# Patient Record
Sex: Female | Born: 1975 | Race: Black or African American | Hispanic: No | Marital: Single | Smoking: Never smoker
Health system: Southern US, Community
[De-identification: ages and names within clinical notes are randomized; demographics above are authoritative.]

## PROBLEM LIST (undated history)

## (undated) DIAGNOSIS — C801 Malignant (primary) neoplasm, unspecified: Secondary | ICD-10-CM

## (undated) DIAGNOSIS — IMO0001 Reserved for inherently not codable concepts without codable children: Secondary | ICD-10-CM

## (undated) DIAGNOSIS — O219 Vomiting of pregnancy, unspecified: Secondary | ICD-10-CM

## (undated) DIAGNOSIS — J45909 Unspecified asthma, uncomplicated: Secondary | ICD-10-CM

## (undated) DIAGNOSIS — T7840XA Allergy, unspecified, initial encounter: Secondary | ICD-10-CM

## (undated) DIAGNOSIS — R896 Abnormal cytological findings in specimens from other organs, systems and tissues: Secondary | ICD-10-CM

## (undated) DIAGNOSIS — Z22330 Carrier of Group B streptococcus: Secondary | ICD-10-CM

## (undated) DIAGNOSIS — B977 Papillomavirus as the cause of diseases classified elsewhere: Secondary | ICD-10-CM

## (undated) DIAGNOSIS — Z8619 Personal history of other infectious and parasitic diseases: Secondary | ICD-10-CM

## (undated) DIAGNOSIS — N870 Mild cervical dysplasia: Secondary | ICD-10-CM

## (undated) DIAGNOSIS — R87619 Unspecified abnormal cytological findings in specimens from cervix uteri: Secondary | ICD-10-CM

## (undated) DIAGNOSIS — IMO0002 Reserved for concepts with insufficient information to code with codable children: Secondary | ICD-10-CM

## (undated) DIAGNOSIS — Z8673 Personal history of transient ischemic attack (TIA), and cerebral infarction without residual deficits: Secondary | ICD-10-CM

## (undated) DIAGNOSIS — K219 Gastro-esophageal reflux disease without esophagitis: Secondary | ICD-10-CM

## (undated) HISTORY — DX: Abnormal cytological findings in specimens from other organs, systems and tissues: R89.6

## (undated) HISTORY — DX: Unspecified asthma, uncomplicated: J45.909

## (undated) HISTORY — DX: Reserved for inherently not codable concepts without codable children: IMO0001

## (undated) HISTORY — DX: Papillomavirus as the cause of diseases classified elsewhere: B97.7

## (undated) HISTORY — DX: Gastro-esophageal reflux disease without esophagitis: K21.9

## (undated) HISTORY — DX: Malignant (primary) neoplasm, unspecified: C80.1

## (undated) HISTORY — DX: Reserved for concepts with insufficient information to code with codable children: IMO0002

## (undated) HISTORY — DX: Allergy, unspecified, initial encounter: T78.40XA

## (undated) HISTORY — DX: Personal history of transient ischemic attack (TIA), and cerebral infarction without residual deficits: Z86.73

## (undated) HISTORY — DX: Carrier of group B Streptococcus: Z22.330

## (undated) HISTORY — DX: Vomiting of pregnancy, unspecified: O21.9

## (undated) HISTORY — DX: Personal history of other infectious and parasitic diseases: Z86.19

## (undated) HISTORY — PX: PARTIAL HYSTERECTOMY: SHX80

## (undated) HISTORY — DX: Mild cervical dysplasia: N87.0

## (undated) HISTORY — DX: Unspecified abnormal cytological findings in specimens from cervix uteri: R87.619

---

## 1997-11-17 ENCOUNTER — Encounter: Admission: RE | Admit: 1997-11-17 | Discharge: 1997-11-17 | Payer: Self-pay | Admitting: *Deleted

## 1999-05-18 ENCOUNTER — Other Ambulatory Visit: Admission: RE | Admit: 1999-05-18 | Discharge: 1999-05-18 | Payer: Self-pay | Admitting: Internal Medicine

## 1999-08-12 ENCOUNTER — Other Ambulatory Visit: Admission: RE | Admit: 1999-08-12 | Discharge: 1999-08-12 | Payer: Self-pay | Admitting: Internal Medicine

## 2001-02-19 ENCOUNTER — Other Ambulatory Visit: Admission: RE | Admit: 2001-02-19 | Discharge: 2001-02-19 | Payer: Self-pay | Admitting: *Deleted

## 2001-08-29 ENCOUNTER — Inpatient Hospital Stay (HOSPITAL_COMMUNITY): Admission: AD | Admit: 2001-08-29 | Discharge: 2001-08-31 | Payer: Self-pay | Admitting: Gynecology

## 2001-10-08 ENCOUNTER — Other Ambulatory Visit: Admission: RE | Admit: 2001-10-08 | Discharge: 2001-10-08 | Payer: Self-pay | Admitting: Gynecology

## 2002-07-13 ENCOUNTER — Emergency Department (HOSPITAL_COMMUNITY): Admission: EM | Admit: 2002-07-13 | Discharge: 2002-07-13 | Payer: Self-pay | Admitting: Emergency Medicine

## 2002-11-13 ENCOUNTER — Other Ambulatory Visit: Admission: RE | Admit: 2002-11-13 | Discharge: 2002-11-13 | Payer: Self-pay | Admitting: Gynecology

## 2005-03-28 DIAGNOSIS — N870 Mild cervical dysplasia: Secondary | ICD-10-CM

## 2005-03-28 HISTORY — DX: Mild cervical dysplasia: N87.0

## 2005-06-16 ENCOUNTER — Emergency Department (HOSPITAL_COMMUNITY): Admission: EM | Admit: 2005-06-16 | Discharge: 2005-06-16 | Payer: Self-pay | Admitting: Family Medicine

## 2007-11-02 ENCOUNTER — Emergency Department (HOSPITAL_COMMUNITY): Admission: EM | Admit: 2007-11-02 | Discharge: 2007-11-02 | Payer: Self-pay | Admitting: Emergency Medicine

## 2008-03-28 DIAGNOSIS — Z22330 Carrier of Group B streptococcus: Secondary | ICD-10-CM

## 2008-03-28 HISTORY — DX: Carrier of group B Streptococcus: Z22.330

## 2008-06-25 ENCOUNTER — Inpatient Hospital Stay (HOSPITAL_COMMUNITY): Admission: AD | Admit: 2008-06-25 | Discharge: 2008-06-25 | Payer: Self-pay | Admitting: Obstetrics and Gynecology

## 2008-11-27 ENCOUNTER — Ambulatory Visit (HOSPITAL_COMMUNITY): Admission: RE | Admit: 2008-11-27 | Discharge: 2008-11-27 | Payer: Self-pay | Admitting: Obstetrics and Gynecology

## 2008-11-28 ENCOUNTER — Inpatient Hospital Stay (HOSPITAL_COMMUNITY): Admission: AD | Admit: 2008-11-28 | Discharge: 2008-11-28 | Payer: Self-pay | Admitting: Obstetrics and Gynecology

## 2008-12-10 ENCOUNTER — Ambulatory Visit (HOSPITAL_COMMUNITY): Admission: RE | Admit: 2008-12-10 | Discharge: 2008-12-10 | Payer: Self-pay | Admitting: Obstetrics and Gynecology

## 2008-12-24 ENCOUNTER — Ambulatory Visit (HOSPITAL_COMMUNITY): Admission: RE | Admit: 2008-12-24 | Discharge: 2008-12-24 | Payer: Self-pay | Admitting: Obstetrics and Gynecology

## 2009-01-05 ENCOUNTER — Encounter (INDEPENDENT_AMBULATORY_CARE_PROVIDER_SITE_OTHER): Payer: Self-pay | Admitting: Obstetrics and Gynecology

## 2009-01-05 ENCOUNTER — Inpatient Hospital Stay (HOSPITAL_COMMUNITY): Admission: AD | Admit: 2009-01-05 | Discharge: 2009-01-08 | Payer: Self-pay | Admitting: Obstetrics and Gynecology

## 2009-10-07 ENCOUNTER — Emergency Department (HOSPITAL_COMMUNITY): Admission: EM | Admit: 2009-10-07 | Discharge: 2009-10-07 | Payer: Self-pay | Admitting: Family Medicine

## 2010-04-19 ENCOUNTER — Encounter: Payer: Self-pay | Admitting: Obstetrics and Gynecology

## 2010-07-01 LAB — CBC
HCT: 30.8 % — ABNORMAL LOW (ref 36.0–46.0)
Hemoglobin: 11.1 g/dL — ABNORMAL LOW (ref 12.0–15.0)
MCHC: 33.1 g/dL (ref 30.0–36.0)
MCHC: 33.4 g/dL (ref 30.0–36.0)
MCV: 97.9 fL (ref 78.0–100.0)
MCV: 98.9 fL (ref 78.0–100.0)
Platelets: 210 10*3/uL (ref 150–400)
RBC: 3.41 MIL/uL — ABNORMAL LOW (ref 3.87–5.11)
RDW: 15.6 % — ABNORMAL HIGH (ref 11.5–15.5)

## 2010-07-08 LAB — CBC
Hemoglobin: 13.4 g/dL (ref 12.0–15.0)
MCHC: 33.8 g/dL (ref 30.0–36.0)
Platelets: 209 10*3/uL (ref 150–400)
RDW: 13.2 % (ref 11.5–15.5)

## 2010-07-08 LAB — COMPREHENSIVE METABOLIC PANEL
ALT: 13 U/L (ref 0–35)
Albumin: 3.8 g/dL (ref 3.5–5.2)
Alkaline Phosphatase: 34 U/L — ABNORMAL LOW (ref 39–117)
BUN: 5 mg/dL — ABNORMAL LOW (ref 6–23)
Calcium: 9.6 mg/dL (ref 8.4–10.5)
Glucose, Bld: 93 mg/dL (ref 70–99)
Potassium: 3.6 mEq/L (ref 3.5–5.1)
Sodium: 138 mEq/L (ref 135–145)
Total Protein: 6.9 g/dL (ref 6.0–8.3)

## 2010-07-08 LAB — DIFFERENTIAL
Lymphs Abs: 1.8 10*3/uL (ref 0.7–4.0)
Monocytes Absolute: 0.5 10*3/uL (ref 0.1–1.0)
Monocytes Relative: 5 % (ref 3–12)
Neutro Abs: 6.9 10*3/uL (ref 1.7–7.7)
Neutrophils Relative %: 75 % (ref 43–77)

## 2010-07-08 LAB — AMYLASE: Amylase: 86 U/L (ref 27–131)

## 2010-07-08 LAB — URINALYSIS, ROUTINE W REFLEX MICROSCOPIC
Bilirubin Urine: NEGATIVE
Ketones, ur: NEGATIVE mg/dL
Leukocytes, UA: NEGATIVE
Protein, ur: NEGATIVE mg/dL

## 2010-07-08 LAB — URINE MICROSCOPIC-ADD ON

## 2010-08-13 NOTE — Discharge Summary (Signed)
[facility]  Patient:    Alicia Hanson, Alicia Hanson Visit Number: [id number] MRN: [id number]          Service Type: OBS Location: 9300 [id number] Attending Physician:  Merrily Pew Dictated by:   Antony Contras, [facility] Admit Date:  08/29/2001 Discharge Date: 08/31/2001                             Discharge Summary  DISCHARGE DIAGNOSES:          1. Intrauterine pregnancy at term.                               2. Spontaneous onset of labor.  PROCEDURE:                    Normal spontaneous vaginal delivery of a viable infant over intact perineum.  HISTORY OF PRESENT ILLNESS:   The patient is a 35 year old primigravida, LMP November 21, 2000, [facility] August 29, 2001.  Prenatal course was uncomplicated.  PRENATAL LABORATORY DATA:     Blood type O positive, antibody screen negative, sickle cell negative.  RPR, HBSAG, and HIV nonreactive.  MSAFP normal.  GBS negative.  HOSPITAL COURSE:              The patient was admitted on August 29, 2001, with spontaneous onset of labor.  She progressed to complete dilatation, delivered an Apgars 9 and 87 female infant over intact perineum.  Birth weight 6 pounds 12 ounces.  Postpartum course uncomplicated.  CBC; hematocrit 28.4, hemoglobin 9.4, WBC 18.6, platelets 225.  The patient was able to be discharged in satisfactory condition on her second postpartum day.  DISPOSITION:                  Follow up in six weeks.  Continue prenatal vitamins and iron.  Motrin for pain. Dictated by:   Antony Contras, [facility] Attending Physician:  Merrily Pew DD:  09/17/01 TD:  09/17/01 Job: [id number]

## 2010-08-13 NOTE — H&P (Signed)
[facility]  Patient:    Alicia Hanson, Alicia Hanson Visit Number: [id number] MRN: [id number]          Service Type: OBS Location: 9300 [id number] Attending Physician:  Merrily Pew Dictated by:   Antony Contras, [facility] Admit Date:  08/29/2001 Discharge Date: 08/31/2001                           History and Physical  DATE OF BIRTH:                Jul 18, 1975  DIAGNOSES:                    Spontaneous rupture of membranes at term.  HISTORY OF PRESENT ILLNESS:   Patient is a 35 year old primigravida with an LMP of November 21, 2001, [facility] August 29, 2001.  EDC was also confirmed by ultrasound.  Prenatal course was uncomplicated.  Prenatal laboratories showed blood type O+.  Antibody screen negative.  Sickle cell negative.  RPR, HBSAG, HIV nonreactive.  Rubella immune.  MSAFP normal.  GBS negative.  Patient presented to the office at [redacted] weeks gestation for routine visit and stated that she thought that her membranes had been ruptured since about 11:00 last night when she began to leak clear fluid.  She denies any contractions and reports good fetal movement.  OBJECTIVE:  GENERAL:                      Patient is a well-developed, well-nourished black female who appears to be in no acute distress.  LUNGS:                        Clear to auscultation.  HEART:                        RRR without murmur.  ABDOMEN:                      Soft and gravid.  Positive fetal heart tones.  PELVIC:                       There is presence of clear fluid in the vaginal vault.  Ferning is positive.  Cervix is 1 cm, 50%, -3 station.  VITAL SIGNS:                  Patients weight is 127, blood pressure 112/66.  ASSESSMENT:                   Spontaneous rupture of membranes at term, confirmed by examination.  PLAN:                         Patient will be admitted to labor and delivery for labor management. Dictated by:   Antony Contras, [facility] Attending  Physician:  Merrily Pew DD:  08/29/01 TD:  08/30/01 Job: [id number]

## 2011-06-20 ENCOUNTER — Other Ambulatory Visit: Payer: Self-pay

## 2011-06-21 ENCOUNTER — Other Ambulatory Visit (INDEPENDENT_AMBULATORY_CARE_PROVIDER_SITE_OTHER): Payer: 59

## 2011-06-21 DIAGNOSIS — Z3040 Encounter for surveillance of contraceptives, unspecified: Secondary | ICD-10-CM

## 2011-08-17 ENCOUNTER — Ambulatory Visit (INDEPENDENT_AMBULATORY_CARE_PROVIDER_SITE_OTHER): Payer: 59 | Admitting: Obstetrics and Gynecology

## 2011-08-17 ENCOUNTER — Encounter: Payer: Self-pay | Admitting: Obstetrics and Gynecology

## 2011-08-17 VITALS — BP 104/62 | Resp 14 | Ht 63.0 in | Wt 122.0 lb

## 2011-08-17 DIAGNOSIS — Z124 Encounter for screening for malignant neoplasm of cervix: Secondary | ICD-10-CM

## 2011-08-17 DIAGNOSIS — Z01419 Encounter for gynecological examination (general) (routine) without abnormal findings: Secondary | ICD-10-CM

## 2011-08-17 NOTE — Progress Notes (Signed)
Contraception Depo provera last  Shot 06/21/2011 Last pap 07/2010 Last Mammo never Last Colonoscopy never Last Dexa Scan never Primary MD Dr. Allyne Gee Abuse at Home none  No complaints  Filed Vitals:   08/17/11 0947  BP: 104/62  Resp: 14   ROS: noncontributory  Physical Examination: General appearance - alert, well appearing, and in no distress Neck - supple, no significant adenopathy Chest - clear to auscultation, no wheezes, rales or rhonchi, symmetric air entry Heart - normal rate and regular rhythm Abdomen - soft, nontender, nondistended, no masses or organomegaly Breasts - breasts appear normal, no suspicious masses, no skin or nipple changes or axillary nodes Pelvic - normal external genitalia, vulva, vagina, cervix, uterus and adnexa Back exam - no CVAT Extremities - no edema, redness or tenderness in the calves or thighs  A/P Nl exam RTO 26yr for AEX

## 2011-08-23 LAB — PAP IG W/ RFLX HPV ASCU

## 2011-09-11 ENCOUNTER — Other Ambulatory Visit: Payer: Self-pay | Admitting: Obstetrics and Gynecology

## 2011-09-12 ENCOUNTER — Other Ambulatory Visit (INDEPENDENT_AMBULATORY_CARE_PROVIDER_SITE_OTHER): Payer: 59

## 2011-09-12 ENCOUNTER — Telehealth: Payer: Self-pay

## 2011-09-12 ENCOUNTER — Other Ambulatory Visit: Payer: Self-pay | Admitting: Obstetrics and Gynecology

## 2011-09-12 DIAGNOSIS — Z3009 Encounter for other general counseling and advice on contraception: Secondary | ICD-10-CM

## 2011-09-12 MED ORDER — MEDROXYPROGESTERONE ACETATE 150 MG/ML IM SUSP
150.0000 mg | Freq: Once | INTRAMUSCULAR | Status: AC
  Administered 2011-09-12: 150 mg via INTRAMUSCULAR

## 2011-09-12 NOTE — Progress Notes (Unsigned)
Next Depo due-12-04-2011

## 2011-09-12 NOTE — Telephone Encounter (Signed)
Pt came to the office this am and stated that she needed RX refill for Depo-provera as well as the injection. RX refill was called to [facility] for Depo-provera 150 mg #1 with 3 RF 1 injection every 3 months, pt was advised to pick up RX and to return to the office for injection. Alicia Hanson

## 2011-12-05 ENCOUNTER — Other Ambulatory Visit (INDEPENDENT_AMBULATORY_CARE_PROVIDER_SITE_OTHER): Payer: 59

## 2011-12-05 DIAGNOSIS — Z3009 Encounter for other general counseling and advice on contraception: Secondary | ICD-10-CM

## 2011-12-05 MED ORDER — MEDROXYPROGESTERONE ACETATE 150 MG/ML IM SUSP
150.0000 mg | Freq: Once | INTRAMUSCULAR | Status: AC
  Administered 2011-12-05: 150 mg via INTRAMUSCULAR

## 2011-12-05 NOTE — Progress Notes (Unsigned)
Next Depo due-02-26-2012

## 2012-02-27 ENCOUNTER — Other Ambulatory Visit (INDEPENDENT_AMBULATORY_CARE_PROVIDER_SITE_OTHER): Payer: 59

## 2012-02-27 DIAGNOSIS — Z3009 Encounter for other general counseling and advice on contraception: Secondary | ICD-10-CM

## 2012-02-27 MED ORDER — MEDROXYPROGESTERONE ACETATE 150 MG/ML IM SUSP
150.0000 mg | Freq: Once | INTRAMUSCULAR | Status: AC
  Administered 2012-02-27: 150 mg via INTRAMUSCULAR

## 2012-02-27 NOTE — Progress Notes (Unsigned)
Next Depo due 05-20-2012

## 2012-05-21 ENCOUNTER — Other Ambulatory Visit: Payer: 59

## 2012-05-21 MED ORDER — MEDROXYPROGESTERONE ACETATE 150 MG/ML IM SUSP
150.0000 mg | Freq: Once | INTRAMUSCULAR | Status: AC
  Administered 2012-05-21: 150 mg via INTRAMUSCULAR

## 2012-05-21 NOTE — Progress Notes (Unsigned)
Next Depo due 08-12-2012 

## 2013-04-08 ENCOUNTER — Emergency Department (INDEPENDENT_AMBULATORY_CARE_PROVIDER_SITE_OTHER)
Admission: EM | Admit: 2013-04-08 | Discharge: 2013-04-08 | Disposition: A | Discharge status: Home / Self Care | Payer: Self-pay | Source: Home / Self Care | Attending: Family Medicine | Admitting: Family Medicine

## 2013-04-08 ENCOUNTER — Encounter (HOSPITAL_COMMUNITY): Payer: Self-pay | Admitting: Emergency Medicine

## 2013-04-08 DIAGNOSIS — M542 Cervicalgia: Secondary | ICD-10-CM

## 2013-04-08 DIAGNOSIS — M545 Low back pain, unspecified: Secondary | ICD-10-CM

## 2013-04-08 MED ORDER — IBUPROFEN 800 MG PO TABS
800.0000 mg | ORAL_TABLET | Freq: Once | ORAL | Status: AC
  Administered 2013-04-08: 800 mg via ORAL

## 2013-04-08 MED ORDER — IBUPROFEN 800 MG PO TABS
ORAL_TABLET | ORAL | Status: AC
  Filled 2013-04-08: qty 1
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
## 2013-04-08 NOTE — ED Provider Notes (Signed)
Medical screening examination/treatment/procedure(s) were performed by resident physician or non-physician practitioner and as supervising physician I was immediately available for consultation/collaboration.   Pauline Good MD.   Billy Fischer, MD 04/08/13 [phone]

## 2014-01-27 ENCOUNTER — Encounter (HOSPITAL_COMMUNITY): Payer: Self-pay | Admitting: Emergency Medicine

## 2014-05-24 ENCOUNTER — Emergency Department (INDEPENDENT_AMBULATORY_CARE_PROVIDER_SITE_OTHER)
Admission: EM | Admit: 2014-05-24 | Discharge: 2014-05-24 | Disposition: A | Discharge status: Home / Self Care | Payer: Self-pay | Source: Home / Self Care | Attending: Emergency Medicine | Admitting: Emergency Medicine

## 2014-05-24 ENCOUNTER — Encounter (HOSPITAL_COMMUNITY): Payer: Self-pay | Admitting: Emergency Medicine
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
## 2014-05-24 NOTE — Discharge Instructions (Signed)

## 2015-02-05 ENCOUNTER — Ambulatory Visit: Payer: Self-pay | Admitting: Podiatry

## 2015-02-05 ENCOUNTER — Ambulatory Visit (INDEPENDENT_AMBULATORY_CARE_PROVIDER_SITE_OTHER): Payer: Managed Care, Other (non HMO) | Admitting: Podiatry

## 2015-02-05 ENCOUNTER — Encounter: Payer: Self-pay | Admitting: Podiatry

## 2015-02-05 ENCOUNTER — Ambulatory Visit (INDEPENDENT_AMBULATORY_CARE_PROVIDER_SITE_OTHER): Payer: Managed Care, Other (non HMO)

## 2015-02-05 VITALS — BP 143/83 | HR 89 | Resp 16 | Ht 63.0 in | Wt 140.0 lb

## 2015-02-05 DIAGNOSIS — M79671 Pain in right foot: Secondary | ICD-10-CM

## 2015-02-05 DIAGNOSIS — M79672 Pain in left foot: Secondary | ICD-10-CM | POA: Diagnosis not present

## 2015-02-05 DIAGNOSIS — M722 Plantar fascial fibromatosis: Secondary | ICD-10-CM

## 2015-02-05 MED ORDER — TRIAMCINOLONE ACETONIDE 10 MG/ML IJ SUSP
10.0000 mg | Freq: Once | INTRAMUSCULAR | Status: AC
  Administered 2015-02-05: 10 mg

## 2015-02-05 MED ORDER — DICLOFENAC SODIUM 75 MG PO TBEC: 75.0000 mg | DELAYED_RELEASE_TABLET | Freq: Two times a day (BID) | ORAL | Status: DC

## 2015-02-05 NOTE — Progress Notes (Signed)
   Subjective:    Patient ID: Alicia Hanson, female    DOB: 1975/05/27, 39 y.o.   MRN: [id number]  HPI Patient presents with bilateral foot pain, entire foot. Pt stated, "feel like walking on rocks"; x2 weeks.  Pt also presents with bilateral ankle pain. Pt stated, "ankles roll when walking"; x2 weeks.   Review of Systems  Musculoskeletal: Positive for myalgias.  All other systems reviewed and are negative.      Objective:   Physical Exam        Assessment & Plan:

## 2015-02-05 NOTE — Patient Instructions (Signed)

## 2015-02-08 NOTE — Progress Notes (Signed)
Subjective:     Patient ID: Alicia Hanson, female   DOB: 09-19-1975, 39 y.o.   MRN: [id number]  HPI patient presents with a lot of pain in the heels of both feet and into the arch and states that her feet feel unstable and she knows that they collapse when she walks   Review of Systems  All other systems reviewed and are negative.      Objective:   Physical Exam  Constitutional: She is oriented to person, place, and time.  Cardiovascular: Intact distal pulses.   Musculoskeletal: Normal range of motion.  Neurological: She is oriented to person, place, and time.  Skin: Skin is warm and dry.  Nursing note and vitals reviewed.  neurovascular status found to be intact muscle strength adequate with range of motion within normal limits. Patient's found to have moderate collapse medial longitudinal arch bilateral and is found to have discomfort with inflammation around the plantar heel region bilateral along with mild to moderate discomfort in the arch bilateral. Patient has good digital perfusion and is well oriented 3     Assessment:     Her fasciitis bilateral with mid arch pain along with depression of the arch which is contributory    Plan:     H&P x-rays reviewed education rendered. Today I injected the plantar fascia bilateral at its insertion 3 mg Kenalog 5 mg Xylocaine bilateral and applied fascial brace bilateral. Gave instructions on physical therapy and reappoint to recheck

## 2015-02-26 ENCOUNTER — Encounter: Payer: Self-pay | Admitting: Podiatry

## 2015-02-26 ENCOUNTER — Ambulatory Visit (INDEPENDENT_AMBULATORY_CARE_PROVIDER_SITE_OTHER): Payer: Managed Care, Other (non HMO) | Admitting: Podiatry

## 2015-02-26 VITALS — BP 103/67 | HR 83 | Resp 16

## 2015-02-26 DIAGNOSIS — M722 Plantar fascial fibromatosis: Secondary | ICD-10-CM

## 2015-02-27 NOTE — Progress Notes (Signed)
Subjective:     Patient ID: Alicia Hanson, female   DOB: 30-Nov-1975, 39 y.o.   MRN: [id number]  HPI patient states I'm feeling somewhat better but I know that my arches are part of my problem. The pain in the heel is improved   Review of Systems     Objective:   Physical Exam Neurovascular status intact with significant diminishment of pain in the plantar heel bilateral with moderate depression of the arch noted    Assessment:     Plantar fasciitis bilateral improved with structural malalignment    Plan:     Advised patient on the continuation of conservative treatments and scanned for custom orthotic devices to elevate the arch and give her long-term hopeful resolution of symptoms. Discussed continued physical therapy and stretching

## 2015-03-24 ENCOUNTER — Ambulatory Visit: Payer: Managed Care, Other (non HMO) | Admitting: *Deleted

## 2015-03-24 DIAGNOSIS — M722 Plantar fascial fibromatosis: Secondary | ICD-10-CM

## 2015-03-24 NOTE — Progress Notes (Signed)
Patient ID: Alicia Hanson, female   DOB: 09-12-1975, 39 y.o.   MRN: [id number] Patient presents for orthotic pick up.  Verbal and written break in and wear instructions given.  Patient will follow up in 4 weeks if symptoms worsen or fail to improve.

## 2015-03-24 NOTE — Patient Instructions (Signed)

## 2017-03-19 ENCOUNTER — Other Ambulatory Visit: Payer: Self-pay

## 2017-03-19 ENCOUNTER — Emergency Department (HOSPITAL_COMMUNITY)
Admission: EM | Admit: 2017-03-19 | Discharge: 2017-03-19 | Disposition: A | Discharge status: Home / Self Care | Payer: Self-pay | Attending: Emergency Medicine | Admitting: Emergency Medicine

## 2017-03-19 ENCOUNTER — Encounter (HOSPITAL_COMMUNITY): Payer: Self-pay | Admitting: *Deleted

## 2017-03-19 ENCOUNTER — Emergency Department (HOSPITAL_COMMUNITY): Payer: Self-pay

## 2017-03-19 DIAGNOSIS — R6889 Other general symptoms and signs: Secondary | ICD-10-CM

## 2017-03-19 DIAGNOSIS — R509 Fever, unspecified: Secondary | ICD-10-CM | POA: Insufficient documentation

## 2017-03-19 DIAGNOSIS — R05 Cough: Secondary | ICD-10-CM | POA: Insufficient documentation

## 2017-03-19 DIAGNOSIS — J45909 Unspecified asthma, uncomplicated: Secondary | ICD-10-CM | POA: Insufficient documentation

## 2017-03-19 DIAGNOSIS — Z79899 Other long term (current) drug therapy: Secondary | ICD-10-CM | POA: Insufficient documentation

## 2017-03-19 MED ORDER — GUAIFENESIN-DM 100-10 MG/5ML PO SYRP: 5.0000 mL | ORAL_SOLUTION | Freq: Three times a day (TID) | ORAL | 0 refills | Status: DC | PRN

## 2017-03-19 MED ORDER — ACETAMINOPHEN 325 MG PO TABS
650.0000 mg | ORAL_TABLET | Freq: Once | ORAL | Status: AC | PRN
  Administered 2017-03-19: 650 mg via ORAL
  Filled 2017-03-19: qty 2

## 2017-03-19 NOTE — ED Triage Notes (Signed)
Pt has flu like symptoms since Friday. Reports cough, fever, sweats. Bodyaches, headache, sore throat. Mask on pt at triage.

## 2017-03-19 NOTE — ED Provider Notes (Signed)
[facility] EMERGENCY DEPARTMENT Provider Note   CSN: [id number] Arrival date & time: 03/19/17  0802     History   Chief Complaint Chief Complaint  Patient presents with  . Cough  . Headache    HPI Alicia Hanson is a 41 y.o. female.  HPI Patient presents with cough fever and sweats.  Had for the last 2 days.  Has a slight sore throat but states it is more from the coughing.  Still able to eat and drink but has not had much of an appetite.  Has a history of asthma but rarely has to use her inhaler.  No sick contacts.  States she has had no real sputum production.  Has had a dull headache.  Took Motrin at home this morning and given Tylenol upon arrival.  Temperature 101.3 upon arrival.  No nausea or vomiting.  No diarrhea.  Mild dull headache.  No numbness or weakness.  Dull chest pain and diffuse myalgias. Past Medical History:  Diagnosis Date  . Abnormal Pap smear    Colpo  . ASCUS (atypical squamous cells of undetermined significance) on Pap smear   . Asthma   . Cancer ([place])   . CIN I (cervical intraepithelial neoplasia I) 2007  . 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 H/O varicella   . H/O: stroke   . High risk HPV infection   . History of rubella   . Nausea and vomiting in pregnancy     There are no active problems to display for this patient.   History reviewed. No pertinent surgical history.  OB History    Gravida Para Term Preterm AB Living   2 2 2     2    SAB TAB Ectopic Multiple Live Births           2       Home Medications    Prior to Admission medications   Medication Sig Start Date End Date Taking? Authorizing Provider  albuterol (PROVENTIL HFA;VENTOLIN HFA) 108 (90 BASE) MCG/ACT inhaler Inhale into the lungs.    [provider]  beclomethasone (QVAR) 40 MCG/ACT inhaler Inhale 2 puffs into the lungs 2 (two) times daily.    [provider]  diclofenac (VOLTAREN) 75 MG EC tablet Take 1 tablet (75 mg total) by mouth 2 (two)  times daily. 02/05/15   Wallene Huh, DPM  guaiFENesin-dextromethorphan (ROBITUSSIN DM) 100-10 MG/5ML syrup Take 5 mLs by mouth 3 (three) times daily as needed for cough. 03/19/17   Davonna Belling, MD  levalbuterol [facility] HFA) 45 MCG/ACT inhaler Inhale 1-2 puffs into the lungs every 4 (four) hours as needed.    [provider]  medroxyPROGESTERone (DEPO-PROVERA) 150 MG/ML injection Inject 150 mg into the muscle every 3 (three) months.    [provider]  valACYclovir (VALTREX) 500 MG tablet Take 500 mg by mouth 3 (three) times daily. For shingles    [provider]    Family History Family History  Problem Relation Age of Onset  . Diabetes Mother   . Hypertension Father   . Diabetes Father     Social History Social History   Tobacco Use  . Smoking status: Never Smoker  Substance Use Topics  . Alcohol use: Yes  . Drug use: No     Allergies   Oxycodone-acetaminophen   Review of Systems Review of Systems  Constitutional: Positive for appetite change, fatigue and fever.  HENT: Positive for sore throat. Negative for trouble swallowing and  voice change.   Eyes: Negative for photophobia.  Respiratory: Positive for cough.   Cardiovascular: Negative for chest pain.  Endocrine: Negative for polyuria.  Genitourinary: Negative for dysuria.  Musculoskeletal: Positive for myalgias.  Skin: Negative for rash and wound.  Neurological: Positive for headaches. Negative for weakness.  Hematological: Negative for adenopathy.  Psychiatric/Behavioral: Negative for confusion.     Physical Exam Updated Vital Signs BP 132/73 (BP Location: Right Arm)   Pulse (!) 130   Temp (!) 101.3 F (38.5 C) (Oral)   Resp 20   SpO2 99%   Physical Exam  Constitutional: She is oriented to person, place, and time. She appears well-developed and well-nourished.  HENT:  Head: Normocephalic.  Mild posterior pharyngeal erythema without exudate.  Eyes: EOM are normal.  Pupils are equal, round, and reactive to light.  Neck: Neck supple.  Cardiovascular: Normal rate and regular rhythm.  Pulmonary/Chest: Breath sounds normal.  Somewhat frequent nonproductive coughing but no focal rales or rhonchi.  Abdominal: Soft. There is no tenderness.  Musculoskeletal: Normal range of motion.  Neurological: She is alert and oriented to person, place, and time.  Skin: Skin is warm.  Psychiatric: She has a normal mood and affect.     ED Treatments / Results  Labs (all labs ordered are listed, but only abnormal results are displayed) Labs Reviewed - No data to display  EKG  EKG Interpretation None       Radiology Dg Chest 2 View  Result Date: 03/19/2017 CLINICAL DATA:  41 year old with current history of asthma, presenting with two-day history of nonproductive cough, myalgias and fever. EXAM: CHEST  2 VIEW COMPARISON:  None. FINDINGS: Cardiomediastinal silhouette unremarkable. Lungs clear. Bronchovascular markings normal. Pulmonary vascularity normal. No visible pleural effusions. No pneumothorax. Minimal degenerative changes involving the lower thoracic spine. IMPRESSION: No acute cardiopulmonary disease. Electronically Signed   By: Evangeline Dakin M.D.   On: 03/19/2017 09:19    Procedures Procedures (including critical care time)  Medications Ordered in ED Medications  acetaminophen (TYLENOL) tablet 650 mg (650 mg Oral Given 03/19/17 0818)     Initial Impression / Assessment and Plan / ED Course  I have reviewed the triage vital signs and the nursing notes.  Pertinent labs & imaging results that were available during my care of the patient were reviewed by me and considered in my medical decision making (see chart for details).    Patient with URI symptoms.  Slight sore throat with out exudate and doubt strep.  Flulike illness.  Discussed patient about Tamiflu.  At this point we have decided to defer since it is around 48 hours from onset.  She is  mildly high risk with the asthma but has very well-controlled asthma.  Discharge home with symptomatic treatment.  Heart rate has normalized.  Is not ill-appearing.  Final Clinical Impressions(s) / ED Diagnoses   Final diagnoses:  Flu-like symptoms    ED Discharge Orders        Ordered    guaiFENesin-dextromethorphan (ROBITUSSIN DM) 100-10 MG/5ML syrup  3 times daily PRN     03/19/17 0946       Davonna Belling, MD 03/19/17 [phone]

## 2018-03-29 DIAGNOSIS — J4520 Mild intermittent asthma, uncomplicated: Secondary | ICD-10-CM | POA: Insufficient documentation

## 2018-04-24 ENCOUNTER — Other Ambulatory Visit: Payer: Self-pay | Admitting: Obstetrics and Gynecology

## 2018-04-24 DIAGNOSIS — Z1231 Encounter for screening mammogram for malignant neoplasm of breast: Secondary | ICD-10-CM

## 2018-04-26 ENCOUNTER — Ambulatory Visit
Admission: RE | Admit: 2018-04-26 | Discharge: 2018-04-26 | Disposition: A | Discharge status: Home / Self Care | Payer: BLUE CROSS/BLUE SHIELD | Source: Ambulatory Visit | Attending: Obstetrics and Gynecology | Admitting: Obstetrics and Gynecology

## 2018-04-26 DIAGNOSIS — Z1231 Encounter for screening mammogram for malignant neoplasm of breast: Secondary | ICD-10-CM

## 2018-10-25 DIAGNOSIS — N939 Abnormal uterine and vaginal bleeding, unspecified: Secondary | ICD-10-CM | POA: Insufficient documentation

## 2019-03-28 ENCOUNTER — Ambulatory Visit: Payer: Medicaid Other | Attending: Internal Medicine

## 2019-03-28 DIAGNOSIS — Z20822 Contact with and (suspected) exposure to covid-19: Secondary | ICD-10-CM

## 2019-03-29 LAB — NOVEL CORONAVIRUS, NAA: SARS-CoV-2, NAA: NOT DETECTED

## 2019-06-12 ENCOUNTER — Other Ambulatory Visit: Payer: Self-pay | Admitting: Obstetrics and Gynecology

## 2019-06-12 DIAGNOSIS — N6310 Unspecified lump in the right breast, unspecified quadrant: Secondary | ICD-10-CM

## 2019-06-19 ENCOUNTER — Ambulatory Visit
Admission: RE | Admit: 2019-06-19 | Discharge: 2019-06-19 | Disposition: A | Discharge status: Home / Self Care | Payer: Medicaid Other | Source: Ambulatory Visit | Attending: Obstetrics and Gynecology | Admitting: Obstetrics and Gynecology

## 2019-06-19 ENCOUNTER — Other Ambulatory Visit: Payer: Self-pay | Admitting: Obstetrics and Gynecology

## 2019-06-19 ENCOUNTER — Other Ambulatory Visit: Payer: Self-pay

## 2019-06-19 DIAGNOSIS — R599 Enlarged lymph nodes, unspecified: Secondary | ICD-10-CM

## 2019-06-19 DIAGNOSIS — N6310 Unspecified lump in the right breast, unspecified quadrant: Secondary | ICD-10-CM

## 2019-06-19 DIAGNOSIS — N6320 Unspecified lump in the left breast, unspecified quadrant: Secondary | ICD-10-CM

## 2019-09-20 ENCOUNTER — Other Ambulatory Visit: Payer: Self-pay

## 2019-09-20 ENCOUNTER — Ambulatory Visit
Admission: RE | Admit: 2019-09-20 | Discharge: 2019-09-20 | Disposition: A | Discharge status: Home / Self Care | Payer: Medicaid Other | Source: Ambulatory Visit | Attending: Obstetrics and Gynecology | Admitting: Obstetrics and Gynecology

## 2019-09-20 DIAGNOSIS — R599 Enlarged lymph nodes, unspecified: Secondary | ICD-10-CM

## 2019-09-20 DIAGNOSIS — N6310 Unspecified lump in the right breast, unspecified quadrant: Secondary | ICD-10-CM

## 2019-11-21 ENCOUNTER — Ambulatory Visit
Admission: EM | Admit: 2019-11-21 | Discharge: 2019-11-21 | Disposition: A | Discharge status: Home / Self Care | Payer: Medicaid Other

## 2019-11-21 ENCOUNTER — Other Ambulatory Visit: Payer: Self-pay

## 2019-11-21 DIAGNOSIS — T63441A Toxic effect of venom of bees, accidental (unintentional), initial encounter: Secondary | ICD-10-CM

## 2019-11-21 DIAGNOSIS — L309 Dermatitis, unspecified: Secondary | ICD-10-CM | POA: Diagnosis not present

## 2019-11-21 NOTE — Discharge Instructions (Signed)
Take Benadryl every 6 hours while awake.  Do not drive, operate machinery, or drink alcohol with this medication as it may cause drowsiness.    If you need to be alert and cannot take Benadryl, take Claritin instead once a day.    Call 911 if you have difficulty swallowing or breathing.

## 2019-11-21 NOTE — ED Provider Notes (Signed)
[facility]    CSN: [id number] Arrival date & time: 11/21/19  1727      History   Chief Complaint Chief Complaint  Patient presents with  . Insect Bite    HPI Alicia Hanson is a 44 y.o. female.   Patient presents with a bee sting on her chest which occurred this afternoon.  She denies difficulty swallowing or breathing.  No known allergy to bee stings.  Treatment attempted with an ice pack.  She denies chest pain, shortness of breath, abdominal pain, numbness, weakness, or other symptoms.  The history is provided by the patient.    Past Medical History:  Diagnosis Date  . Abnormal Pap smear    Colpo  . ASCUS (atypical squamous cells of undetermined significance) on Pap smear   . Asthma   . Cancer ([place])   . CIN I (cervical intraepithelial neoplasia I) 2007  . 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 H/O varicella   . H/O: stroke   . High risk HPV infection   . History of rubella   . Nausea and vomiting in pregnancy     There are no problems to display for this patient.   History reviewed. No pertinent surgical history.  OB History    Gravida  2   Para  2   Term  2   Preterm      AB      Living  2     SAB      TAB      Ectopic      Multiple      Live Births  2            Home Medications    Prior to Admission medications   Medication Sig Start Date End Date Taking? Authorizing Provider  albuterol (PROVENTIL HFA;VENTOLIN HFA) 108 (90 BASE) MCG/ACT inhaler Inhale into the lungs.    [provider]  beclomethasone (QVAR) 40 MCG/ACT inhaler Inhale 2 puffs into the lungs 2 (two) times daily.    [provider]  diclofenac (VOLTAREN) 75 MG EC tablet Take 1 tablet (75 mg total) by mouth 2 (two) times daily. 02/05/15   Wallene Huh, DPM  guaiFENesin-dextromethorphan (ROBITUSSIN DM) 100-10 MG/5ML syrup Take 5 mLs by mouth 3 (three) times daily as needed for cough. 03/19/17   Davonna Belling, MD  levalbuterol [facility] HFA) 45  MCG/ACT inhaler Inhale 1-2 puffs into the lungs every 4 (four) hours as needed.    [provider]  medroxyPROGESTERone (DEPO-PROVERA) 150 MG/ML injection Inject 150 mg into the muscle every 3 (three) months.    [provider]  valACYclovir (VALTREX) 500 MG tablet Take 500 mg by mouth 3 (three) times daily. For shingles    [provider]    Family History Family History  Problem Relation Age of Onset  . Diabetes Mother   . Hypertension Father   . Diabetes Father   . Breast cancer Neg Hx     Social History Social History   Tobacco Use  . Smoking status: Never Smoker  Substance Use Topics  . Alcohol use: Yes  . Drug use: No     Allergies   Oxycodone-acetaminophen   Review of Systems Review of Systems  Constitutional: Negative for chills and fever.  HENT: Negative for ear pain, sore throat and trouble swallowing.   Eyes: Negative for pain and visual disturbance.  Respiratory: Negative for cough and shortness of breath.   Cardiovascular: Negative for chest pain  and palpitations.  Gastrointestinal: Negative for abdominal pain and vomiting.  Genitourinary: Negative for dysuria and hematuria.  Musculoskeletal: Negative for arthralgias and back pain.  Skin: Positive for wound. Negative for color change and rash.  Neurological: Negative for seizures and syncope.  All other systems reviewed and are negative.    Physical Exam Triage Vital Signs ED Triage Vitals  Enc Vitals Group     BP 11/21/19 1733 118/81     Pulse Rate 11/21/19 1733 72     Resp 11/21/19 1733 18     Temp 11/21/19 1733 98.9 F (37.2 C)     Temp src --      SpO2 11/21/19 1733 98 %     Weight --      Height --      Head Circumference --      Peak Flow --      Pain Score 11/21/19 1732 10     Pain Loc --      Pain Edu? --      Excl. in [place]? --    No data found.  Updated Vital Signs BP 118/81   Pulse 72   Temp 98.9 F (37.2 C)   Resp 18   SpO2 98%   Visual  Acuity Right Eye Distance:   Left Eye Distance:   Bilateral Distance:    Right Eye Near:   Left Eye Near:    Bilateral Near:     Physical Exam Vitals and nursing note reviewed.  Constitutional:      General: She is not in acute distress.    Appearance: She is well-developed. She is not ill-appearing.  HENT:     Head: Normocephalic and atraumatic.     Mouth/Throat:     Mouth: Mucous membranes are moist.     Pharynx: Oropharynx is clear.     Comments: Speech clear.  No difficulty swallowing. Eyes:     Conjunctiva/sclera: Conjunctivae normal.  Cardiovascular:     Rate and Rhythm: Normal rate and regular rhythm.     Heart sounds: No murmur heard.   Pulmonary:     Effort: Pulmonary effort is normal. No respiratory distress.     Breath sounds: Normal breath sounds. No stridor. No wheezing or rhonchi.  Abdominal:     Palpations: Abdomen is soft.     Tenderness: There is no abdominal tenderness.  Musculoskeletal:     Cervical back: Neck supple.  Skin:    General: Skin is warm and dry.     Findings: Erythema present.     Comments: Right upper chest light pink; no visible lesion.  Neurological:     General: No focal deficit present.     Mental Status: She is alert and oriented to person, place, and time.     Gait: Gait normal.  Psychiatric:        Mood and Affect: Mood normal.        Behavior: Behavior normal.      UC Treatments / Results  Labs (all labs ordered are listed, but only abnormal results are displayed) Labs Reviewed - No data to display  EKG   Radiology No results found.  Procedures Procedures (including critical care time)  Medications Ordered in UC Medications - No data to display  Initial Impression / Assessment and Plan / UC Course  I have reviewed the triage vital signs and the nursing notes.  Pertinent labs & imaging results that were available during my care of the patient were reviewed by  me and considered in my medical decision making  (see chart for details).   Bee sting, dermatitis.  Patient is well-appearing and in no acute distress.  Instructed patient to take Benadryl every 6 hours.  Precautions for drowsiness with Benadryl discussed with patient.  Instructed her to take Claritin instead if she needs to be alert.  Instructed her to call 911 if she has difficulty swallowing or breathing.  Patient agrees to plan of care.   Final Clinical Impressions(s) / UC Diagnoses   Final diagnoses:  Bee sting, accidental or unintentional, initial encounter  Dermatitis     Discharge Instructions     Take Benadryl every 6 hours while awake.  Do not drive, operate machinery, or drink alcohol with this medication as it may cause drowsiness.    If you need to be alert and cannot take Benadryl, take Claritin instead once a day.    Call 911 if you have difficulty swallowing or breathing.        ED Prescriptions    None     PDMP not reviewed this encounter.   Sharion Balloon, NP 11/21/19 [phone]

## 2019-11-21 NOTE — ED Triage Notes (Signed)
Patient reports she was stung by a bee around 1600 this afternoon.

## 2019-12-10 ENCOUNTER — Other Ambulatory Visit: Payer: Self-pay | Admitting: Obstetrics and Gynecology

## 2019-12-10 DIAGNOSIS — R2231 Localized swelling, mass and lump, right upper limb: Secondary | ICD-10-CM

## 2019-12-25 ENCOUNTER — Other Ambulatory Visit: Payer: Self-pay

## 2019-12-25 ENCOUNTER — Ambulatory Visit
Admission: RE | Admit: 2019-12-25 | Discharge: 2019-12-25 | Disposition: A | Discharge status: Home / Self Care | Payer: Medicaid Other | Source: Ambulatory Visit | Attending: Obstetrics and Gynecology | Admitting: Obstetrics and Gynecology

## 2019-12-25 DIAGNOSIS — R2231 Localized swelling, mass and lump, right upper limb: Secondary | ICD-10-CM

## 2020-01-11 ENCOUNTER — Ambulatory Visit: Payer: Medicaid Other | Attending: Internal Medicine

## 2020-01-11 DIAGNOSIS — Z23 Encounter for immunization: Secondary | ICD-10-CM

## 2020-01-11 NOTE — Progress Notes (Signed)
   Covid-19 Vaccination Clinic  Name:  Jolinda Pinkstaff    MRN: [id number] DOB: 03/17/1976  01/11/2020  Ms. Meinders was observed post Covid-19 immunization for 15 minutes without incident. She was provided with Vaccine Information Sheet and instruction to access the V-Safe system.   Ms. Essman was instructed to call 911 with any severe reactions post vaccine: Marland Kitchen Difficulty breathing  . Swelling of face and throat  . A fast heartbeat  . A bad rash all over body  . Dizziness and weakness

## 2020-11-17 ENCOUNTER — Encounter: Payer: Self-pay | Admitting: Allergy & Immunology

## 2020-11-17 ENCOUNTER — Ambulatory Visit: Payer: BC Managed Care – PPO | Admitting: Allergy & Immunology

## 2020-11-17 ENCOUNTER — Other Ambulatory Visit: Payer: Self-pay

## 2020-11-17 VITALS — BP 124/76 | HR 71 | Temp 98.8°F | Ht 62.0 in | Wt 171.0 lb

## 2020-11-17 DIAGNOSIS — J4530 Mild persistent asthma, uncomplicated: Secondary | ICD-10-CM

## 2020-11-17 DIAGNOSIS — K9049 Malabsorption due to intolerance, not elsewhere classified: Secondary | ICD-10-CM

## 2020-11-17 DIAGNOSIS — J310 Chronic rhinitis: Secondary | ICD-10-CM

## 2020-11-17 NOTE — Patient Instructions (Signed)
1. Mild persistent asthma, uncomplicated - Lung testing looks fairly good today. - I do not like that you are coughing a couple of times a week at night, which is probably interrupting her sleep. - Humor me and start Armonair 231mg 1 puff at night to see if this can help with controlling inflammation in your lungs and decrease the coughing. - Daily controller medication(s): Armonair 2360m 1 puff at night - Prior to physical activity: ProAir 2 puffs 10-15 minutes before physical activity. - Rescue medications: ProAir 4 puffs every 4-6 hours as needed - Changes during respiratory infections or worsening symptoms: Increase Armonair 23279mto 1 puff twice daily for TWO WEEKS. - Asthma control goals:  * Full participation in all desired activities (may need albuterol before activity) * Albuterol use two time or less a week on average (not counting use with activity) * Cough interfering with sleep two time or less a month * Oral steroids no more than once a year * No hospitalizations  2. Chronic rhinitis - Testing today showed: Negative to the entire panel, although the positive control was nonreactive - We are going to get lab work to confirm this - Copy of test results provided.  - Avoidance measures provided. - Continue with: Xyzal (levocetirizine) '5mg'$  tablet once daily - You can use an extra dose of the antihistamine, if needed, for breakthrough symptoms.  - Consider nasal saline rinses 1-2 times daily to remove allergens from the nasal cavities as well as help with mucous clearance (this is especially helpful to do before the nasal sprays are given)  3. Food intolerance - Testing was negative to pineapple and strawberry. - We we will get lab work to confirm. - We will hold off on epinephrine at this point in time. - We we will call you in 1 to 2 weeks for the results of the testing.  4. No follow-ups on file.    Please inform us [place] any Emergency Department visits, hospitalizations,  or changes in symptoms. Call us [place] going to the ED for breathing or allergy symptoms since we might be able to fit you in for a sick visit. Feel free to contact us [place] with any questions, problems, or concerns.  It was a pleasure to meet you today!  Websites that have reliable patient information: 1. American Academy of Asthma, Allergy, and Immunology: www.aaaai.org 2. Food Allergy Research and Education (FARE): foodallergy.org 3. Mothers of Asthmatics: http://www.asthmacommunitynetwork.org 4. American College of Allergy, Asthma, and Immunology: www.acaai.org   COVID-19 Vaccine Information can be found at: [email or web address] questions related to vaccine distribution or appointments, please email [email or web address] or call [phone] We realize that you might be concerned about having an allergic reaction to the COVID19 vaccines. To help with that concern, WE ARE OFFERING THE COVID19 VACCINES IN OUR OFFICE! Ask the front desk for dates!     "Like" us [place] Facebook and Instagram for our latest updates!      A healthy democracy works best when ALLNew [place] Life Insurancerticipate! Make sure you are registered to vote! If you have moved or changed any of your contact information, you will need to get this updated before voting!  In some cases, you MAY be able to register to vote online: [email or web address]

## 2020-11-17 NOTE — Addendum Note (Signed)
Addended by: Larence Penning on: 11/17/2020 06:57 PM   Modules accepted: Orders

## 2020-11-17 NOTE — Progress Notes (Signed)
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 Mild persistent asthma, uncomplicated - Lung testing looks fairly good today. - I do not like that you are coughing a couple of times a week at night, which is probably interrupting her sleep. - Humor me and start Armonair 232mg 1 puff at night to see if this can help with controlling inflammation in your lungs and decrease the coughing. - Daily controller medication(s): Armonair 2364m 1 puff at night - Prior to physical activity: ProAir 2 puffs 10-15 minutes before physical activity. - Rescue medications: ProAir 4 puffs every 4-6 hours as needed - Changes during respiratory infections or worsening symptoms: Increase Armonair 2325mto 1 puff twice daily for TWO WEEKS. - Asthma control goals:  * Full participation in all desired activities (may need albuterol before activity) * Albuterol use two time or less a week on average (not counting use with activity) * Cough interfering with sleep two time or less a month * Oral steroids no more than once a year * No hospitalizations  2. Chronic rhinitis - Testing today showed: Negative to the entire panel, although the positive control was nonreactive - We are going to get lab work to confirm this - Copy of test results provided.  - Avoidance measures provided. - Continue with: Xyzal (levocetirizine) '5mg'$  tablet once daily - You can use an extra dose of the antihistamine, if needed, for breakthrough symptoms.  - Consider nasal saline rinses 1-2 times daily to remove allergens from the nasal cavities as well as help with mucous clearance (this is especially helpful to do before the nasal sprays are given)  3. Food  intolerance - Testing was negative to pineapple and strawberry. - We we will get lab work to confirm. - We will hold off on epinephrine at this point in time. - We we will call you in 1 to 2 weeks for the results of the testing.  4. Follow up in 3 months or earlier if needed.     This note in its entirety was forwarded to the Provider who requested this consultation.  Subjective:   Alicia Hanson a 45 35o. female presenting today for evaluation of  Chief Complaint  Patient presents with   Establish Care   Asthma   Allergy Testing    Alicia Hanson a history of the following: Patient Active Problem List   Diagnosis Date Noted   Chronic rhinitis 11/17/2020   Food intolerance 11/17/2020   Mild persistent asthma, uncomplicated [id number]  History obtained from: chart review and patient.  Alicia Hanson referred by Patient, No Pcp Per (Inactive).     Alicia Hanson a 45 8o. female presenting for an evaluation of asthma and allergies .   Asthma/Respiratory Symptom History: She is on Qvar two puffs as needed in combination with Xopenex. She needs them depending on the weather. 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 She has never needed prednisone for her breathing and has never had hospitalization. She does report coughing very often.  This is around twice per week. She has not refilled her medications very often - she thinks that she got a refill  in early 2020.   She did go to Urgent Care in Jan and Feb 2020. She was diagnosed with URIs. She thinks she might have had early COVID.  There was last time she received a Qvar refill.  She thinks that is the last time she got prednisone as well.  Allergic Rhinitis Symptom History: She is interested in allergy testing.  She does have environmental allergy symptoms including rhinorrhea and sneezing throughout the year. She does have coughing with some postnasal drip.  She currently takes Xyzal.  This seems to control everything.  Food Allergy Symptom History: He itches from pineapple and strawberries.  She does not have any throat involvement.  She has never had an EpiPen.  She otherwise tolerates major food allergens without adverse event  Otherwise, there is no history of other atopic diseases, including drug allergies, eczema, urticaria, or contact dermatitis. There is no significant infectious history. Vaccinations are up to date.    Past Medical History: Patient Active Problem List   Diagnosis Date Noted   Chronic rhinitis 11/17/2020   Food intolerance 11/17/2020   Mild persistent asthma, uncomplicated [id number]     Medication List:  Allergies as of 11/17/2020       Reactions   Oxycodone-acetaminophen Hives, Itching, Nausea And Vomiting        Medication List        Accurate as of November 17, 2020 10:20 AM. If you have any questions, ask your nurse or doctor.          STOP taking these medications    diclofenac 75 MG EC tablet Commonly known as: VOLTAREN Stopped by: Valentina Shaggy, MD   guaiFENesin-dextromethorphan 100-10 MG/5ML syrup Commonly known as: ROBITUSSIN DM Stopped by: Valentina Shaggy, MD   medroxyPROGESTERone 150 MG/ML injection Commonly known as: DEPO-PROVERA Stopped by: Valentina Shaggy, MD   valACYclovir 500 MG tablet Commonly known as: VALTREX Stopped by: Valentina Shaggy, MD       TAKE these medications    albuterol 108 (90 Base) MCG/ACT inhaler Commonly known as: VENTOLIN HFA Inhale into the lungs.   beclomethasone 40 MCG/ACT inhaler Commonly known as: QVAR Inhale 2 puffs into the lungs 2 (two) times daily.   levalbuterol 45 MCG/ACT inhaler Commonly known as: XOPENEX HFA Inhale 1-2 puffs into the lungs every 4 (four) hours as needed.   levocetirizine 5 MG tablet Commonly known as: XYZAL SMARTSIG:1 Tablet(s) By Mouth Every Evening        Birth History:  non-contributory  Developmental History: non-contributory  Past Surgical History: No past surgical history on file.   Family History: Family History  Problem Relation Age of Onset   Diabetes Mother    Hypertension Father    Diabetes Father    Breast cancer Neg Hx      Social History: Alicia Hanson lives at home with her 16 year old daughter. 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  She has a 62 year old daughter who attends [employer] and is majoring in Scientist, research (life sciences).  She currently lives in a house that is 45 years old.  There is laminate throughout the home.  She has gas heating and central cooling.  There are no animals inside or outside of the home.  She does have dust mite covers on her bedding.  There is no tobacco exposure.  She does use a HEPA filter.  She is exposed to fumes, chemicals, and dust  as a bus driver.  She has never been a smoker.   Review of Systems  Constitutional: Negative.  Negative for chills, fever, malaise/fatigue and weight loss.  HENT: Negative.  Negative for congestion, ear discharge, ear pain and sore throat.   Eyes:  Negative for pain, discharge and redness.  Respiratory:  Negative for cough, sputum production, shortness of breath and wheezing.   Cardiovascular: Negative.  Negative for chest pain and palpitations.  Gastrointestinal:  Negative for abdominal pain, constipation, diarrhea, heartburn, nausea and vomiting.  Skin: Negative.  Negative for itching and rash.  Neurological:  Negative for dizziness and headaches.  Endo/Heme/Allergies:  Negative for environmental allergies. Does not bruise/bleed easily.      Objective:   Blood pressure 124/76, pulse 71, temperature 98.8 F (37.1 C), temperature source Temporal, height '5\' 2"'$  (1.575 m), weight 171 lb (77.6 kg), SpO2 98 %. Body mass index is 31.28 kg/m.   Physical Exam:   Physical Exam Vitals reviewed.   Constitutional:      Appearance: She is well-developed.  HENT:     Head: Normocephalic and atraumatic.     Right Ear: Tympanic membrane, ear canal and external ear normal. No drainage, swelling or tenderness. Tympanic membrane is not injected, scarred, erythematous, retracted or bulging.     Left Ear: Tympanic membrane, ear canal and external ear normal. No drainage, swelling or tenderness. Tympanic membrane is not injected, scarred, erythematous, retracted or bulging.     Nose: No nasal deformity, septal deviation, mucosal edema or rhinorrhea.     Right Turbinates: Enlarged, swollen and pale.     Left Turbinates: Enlarged, swollen and pale.     Right Sinus: No maxillary sinus tenderness or frontal sinus tenderness.     Left Sinus: No maxillary sinus tenderness or frontal sinus tenderness.     Comments: No nasal polyps.    Mouth/Throat:     Mouth: Mucous membranes are not pale and not dry.     Pharynx: Uvula midline.  Eyes:     General: Lids are normal. Allergic shiner present.        Right eye: No discharge.        Left eye: No discharge.     Conjunctiva/sclera: Conjunctivae normal.     Right eye: Right conjunctiva is not injected. No chemosis.    Left eye: Left conjunctiva is not injected. No chemosis.    Pupils: Pupils are equal, round, and reactive to light.  Cardiovascular:     Rate and Rhythm: Normal rate and regular rhythm.     Heart sounds: Normal heart sounds.  Pulmonary:     Effort: Pulmonary effort is normal. No tachypnea, accessory muscle usage or respiratory distress.     Breath sounds: Normal breath sounds. No wheezing, rhonchi or rales.     Comments: Moving air well in all lung fields.  No increased work of breathing.  Chest:     Chest wall: No tenderness.  Abdominal:     Tenderness: There is no abdominal tenderness. There is no guarding or rebound.  Lymphadenopathy:     Head:     Right side of head: No submandibular, tonsillar or occipital adenopathy.     Left  side of head: No submandibular, tonsillar or occipital adenopathy.     Cervical: No cervical adenopathy.  Skin:    Coloration: Skin is not pale.     Findings: No abrasion, erythema, petechiae or rash. Rash is not papular, urticarial or vesicular.  Neurological:  Mental Status: She is alert.  Psychiatric:        Behavior: Behavior is cooperative.     Diagnostic studies:    Spirometry: results normal (FEV1: 2.43/92%, FVC: 2.75/91%, FEV1/FVC: 88%).    Spirometry consistent with normal pattern.    Allergy Studies:     Airborne Adult Perc - 11/17/20 0939     Time Antigen Placed [id number]    Allergen Manufacturer Lavella Hammock    Location Back    Number of Test 59    1. Control-Buffer 50% Glycerol Negative    2. Control-Histamine 1 mg/ml Negative    3. Albumin saline Negative    4. [place] Negative    5. [place] Negative    6. Johnson Negative    7. Coats Blue Negative    8. Meadow Fescue Negative    9. Perennial Rye Negative    10. Sweet Vernal Negative    11. Timothy Negative    12. Cocklebur Negative    13. Burweed Marshelder Negative    14. Ragweed, short Negative    15. Ragweed, Giant Negative    16. Plantain,  English Negative    17. Lamb's Quarters Negative    18. Sheep Sorrell Negative    19. Rough Pigweed Negative    20. Marsh Elder, Rough Negative    21. Mugwort, Common Negative    22. Ash mix Negative    23. Birch mix Negative    24. Beech American Negative    25. Box, Elder Negative    26. Cedar, red Negative    27. Cottonwood, [place] Negative    28. Elm mix Negative    29. Hickory Negative    30. Maple mix Negative    31. Oak, [place] mix Negative    32. Pecan Pollen Negative    33. Pine mix Negative    34. Sycamore Eastern Negative    35. Mariposa, Black Pollen Negative    36. Alternaria alternata Negative    37. Cladosporium Herbarum Negative    38. Aspergillus mix Negative    39. Penicillium mix Negative    40. Bipolaris sorokiniana (Helminthosporium)  Negative    41. Drechslera spicifera (Curvularia) Negative    42. Mucor plumbeus Negative    43. Fusarium moniliforme Negative    44. Aureobasidium pullulans (pullulara) Negative    45. Rhizopus oryzae Negative    46. Botrytis cinera Negative    47. Epicoccum nigrum Negative    48. Phoma betae Negative    49. Candida Albicans Negative    50. Trichophyton mentagrophytes Negative    51. Mite, D Farinae  5,000 AU/ml Negative    52. Mite, D Pteronyssinus  5,000 AU/ml Negative    53. Cat Hair 10,000 BAU/ml Negative    54.  Dog Epithelia Negative    55. Mixed Feathers Negative    56. Horse Epithelia Negative    57. Cockroach, German Negative    58. Mouse Negative    59. Tobacco Leaf Negative             Food Adult Perc - 11/17/20 0900     Time Antigen Placed [id number]    Allergen Manufacturer Lavella Hammock    Location Back    Number of allergen test 3    Control-Histamine 1 mg/ml Negative    60. Strawberry Negative    63. Pineapple Negative             Allergy testing results were read and interpreted by myself, documented by clinical  staff.         Salvatore Marvel, MD [facility]

## 2020-11-18 ENCOUNTER — Telehealth: Payer: Self-pay | Admitting: Allergy & Immunology

## 2020-11-18 ENCOUNTER — Encounter: Payer: Self-pay | Admitting: Allergy & Immunology

## 2020-11-18 NOTE — Telephone Encounter (Signed)
Patient states she did not reaction to anything when she did an allergy test yesterday, but she states her back is very itchy today. Patient states she has hives on the right side of her back below her shoulder blade. Patient is going to set up her mychart account and send an image. Patient did not want to take anything until she spoke with someone here.  Please advise.

## 2020-11-19 MED ORDER — LEVOCETIRIZINE DIHYDROCHLORIDE 5 MG PO TABS: 5.0000 mg | ORAL_TABLET | Freq: Two times a day (BID) | ORAL | 5 refills | Status: DC | PRN

## 2020-11-19 MED ORDER — TRIAMCINOLONE ACETONIDE 0.1 % EX OINT: 1.0000 "application " | TOPICAL_OINTMENT | Freq: Two times a day (BID) | CUTANEOUS | 0 refills | Status: DC

## 2020-11-19 NOTE — Telephone Encounter (Signed)
I agree with that plan.  Unfortunately, delayed reactions like this cannot be interpreted to show an allergy.  Salvatore Marvel, MD [facility]

## 2020-11-19 NOTE — Telephone Encounter (Signed)
Called and checked on patient this morning and she stated that she was still having the rash but the itching subsided after taking Xyzal BID and Benadryl last night. I discussed the mychart message and our conversation with Dr. Ernst Bowler and he informed me to send in Alicia Hanson as well as continue the Xyzal BID. Patient was informed and medication was sent in.

## 2020-11-21 LAB — ALLERGEN, PINEAPPLE, F210: Pineapple IgE: <0.1 kU/L

## 2020-11-21 LAB — ALLERGENS W/COMP RFLX AREA 2
Alternaria Alternata IgE: <0.1 kU/L
Aspergillus Fumigatus IgE: <0.1 kU/L
Bermuda Grass IgE: <0.1 kU/L
Cedar, Mountain IgE: <0.1 kU/L
Cladosporium Herbarum IgE: <0.1 kU/L
Cockroach, German IgE: <0.1 kU/L
Common Silver Birch IgE: <0.1 kU/L
Cottonwood IgE: <0.1 kU/L
D Farinae IgE: <0.1 kU/L
D Pteronyssinus IgE: <0.1 kU/L
E001-IgE Cat Dander: <0.1 kU/L
E005-IgE Dog Dander: <0.1 kU/L
Elm, American IgE: <0.1 kU/L
IgE (Immunoglobulin E), Serum: 17 IU/mL (ref 6–495)
Johnson Grass IgE: <0.1 kU/L
Maple/Box Elder IgE: <0.1 kU/L
Mouse Urine IgE: <0.1 kU/L
Oak, White IgE: <0.1 kU/L
Pecan, Hickory IgE: <0.1 kU/L
Penicillium Chrysogen IgE: <0.1 kU/L
Pigweed, Rough IgE: <0.1 kU/L
Ragweed, Short IgE: <0.1 kU/L
Sheep Sorrel IgE Qn: <0.1 kU/L
Timothy Grass IgE: <0.1 kU/L
White Mulberry IgE: <0.1 kU/L

## 2020-11-21 LAB — ALLERGEN, STRAWBERRY, F44: Allergen Strawberry IgE: <0.1 kU/L

## 2021-02-23 ENCOUNTER — Ambulatory Visit: Payer: BC Managed Care – PPO | Admitting: Allergy & Immunology

## 2021-03-18 ENCOUNTER — Ambulatory Visit: Payer: BC Managed Care – PPO | Admitting: Allergy & Immunology

## 2021-03-29 IMAGING — MG DIGITAL DIAGNOSTIC BILAT W/ TOMO W/ CAD
6 of 10 series · 6 of 30 positions shown · non-contrast
Comparison: Previous exam(s).

CLINICAL DATA: 43-year-old female who received her second DP25U-HW
vaccine in the left arm on 06/15/2019. The patient complains of a
palpable superficial stable mass in the right axilla for 2 months.

EXAM:
DIGITAL DIAGNOSTIC BILATERAL MAMMOGRAM WITH CAD AND TOMO
ULTRASOUND LEFT BREAST

[R CC synth-2D]
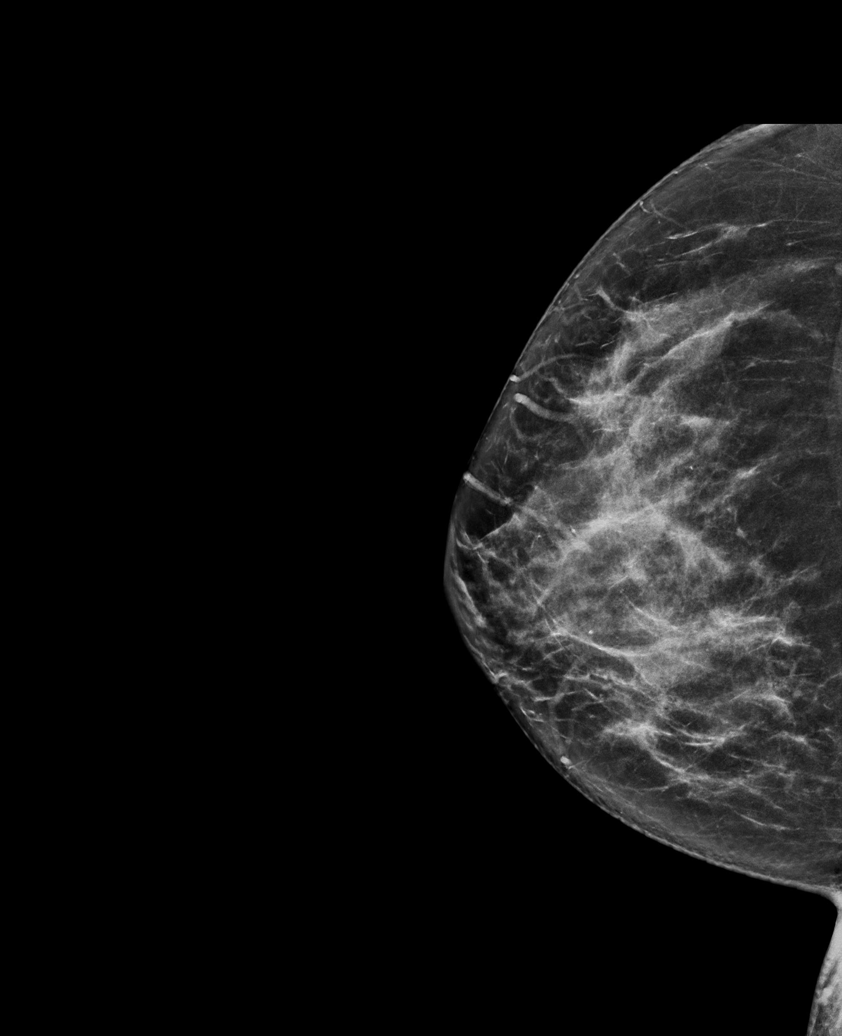

[R MLO synth-2D]
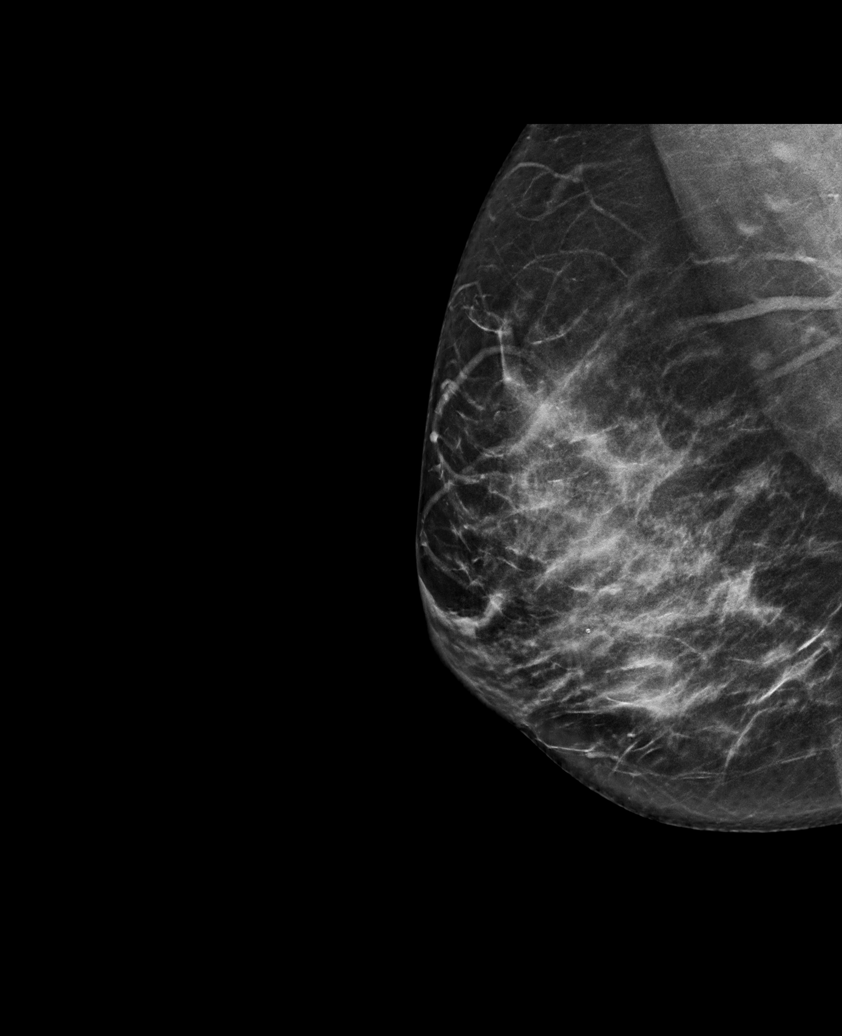

[R TAN synth-2D]
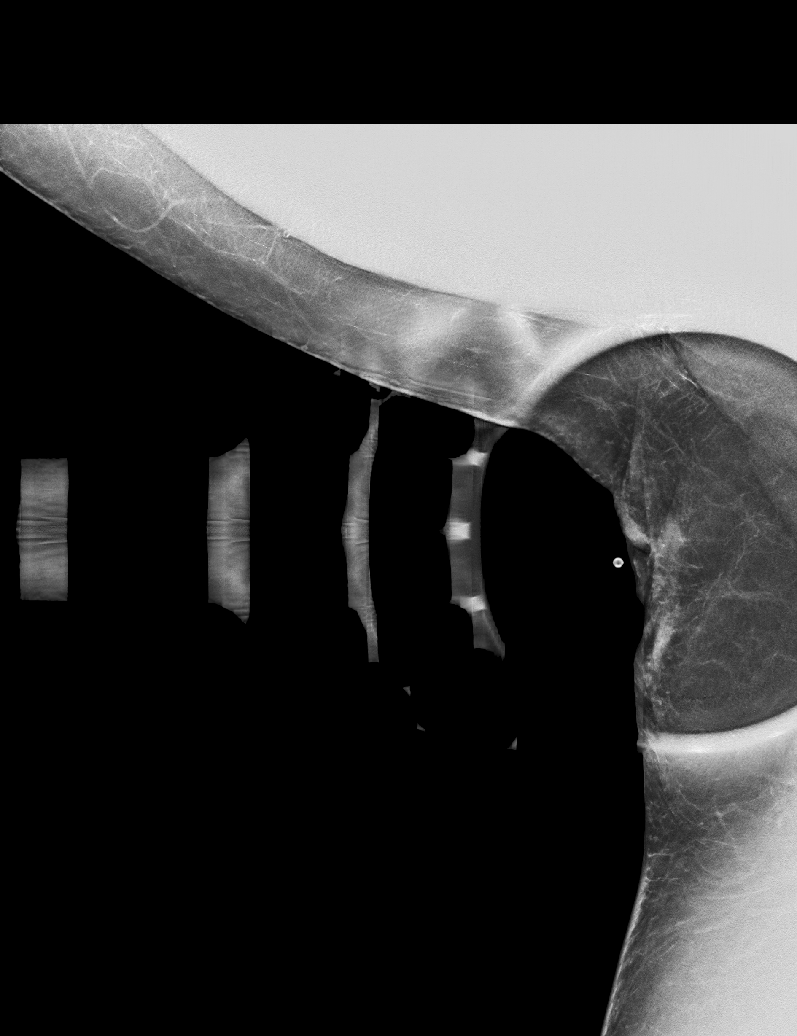

[L MLO synth-2D]
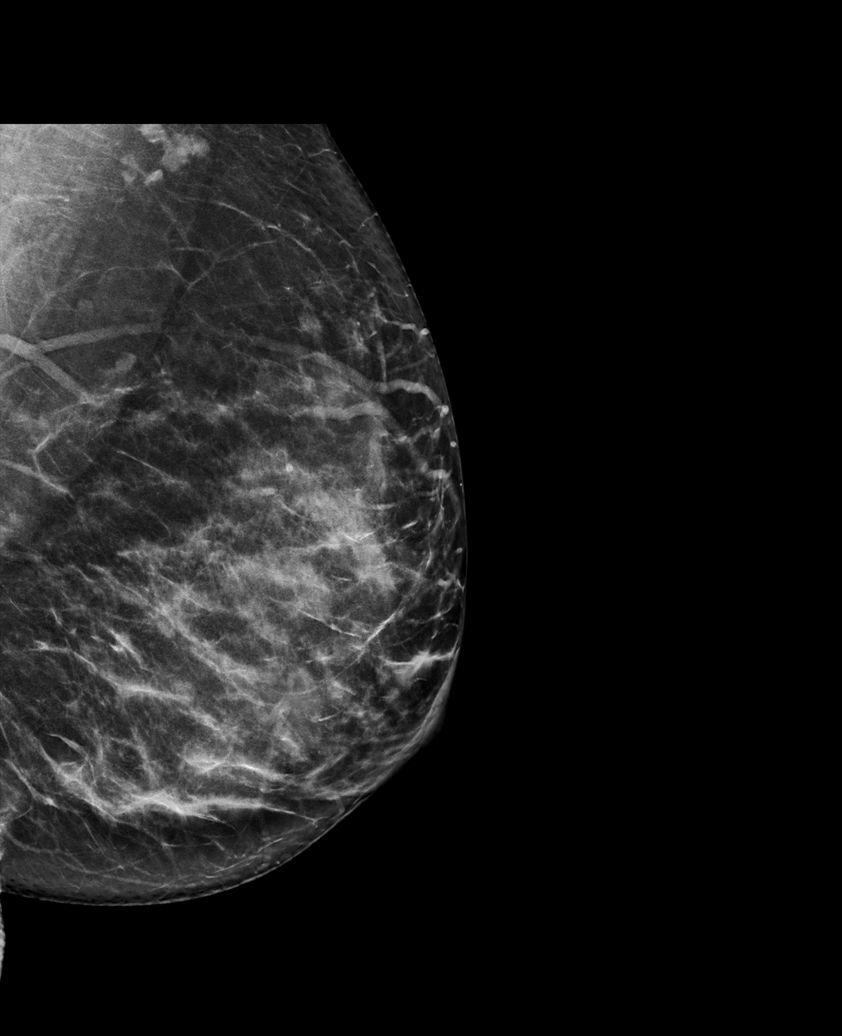

[L CC synth-2D]
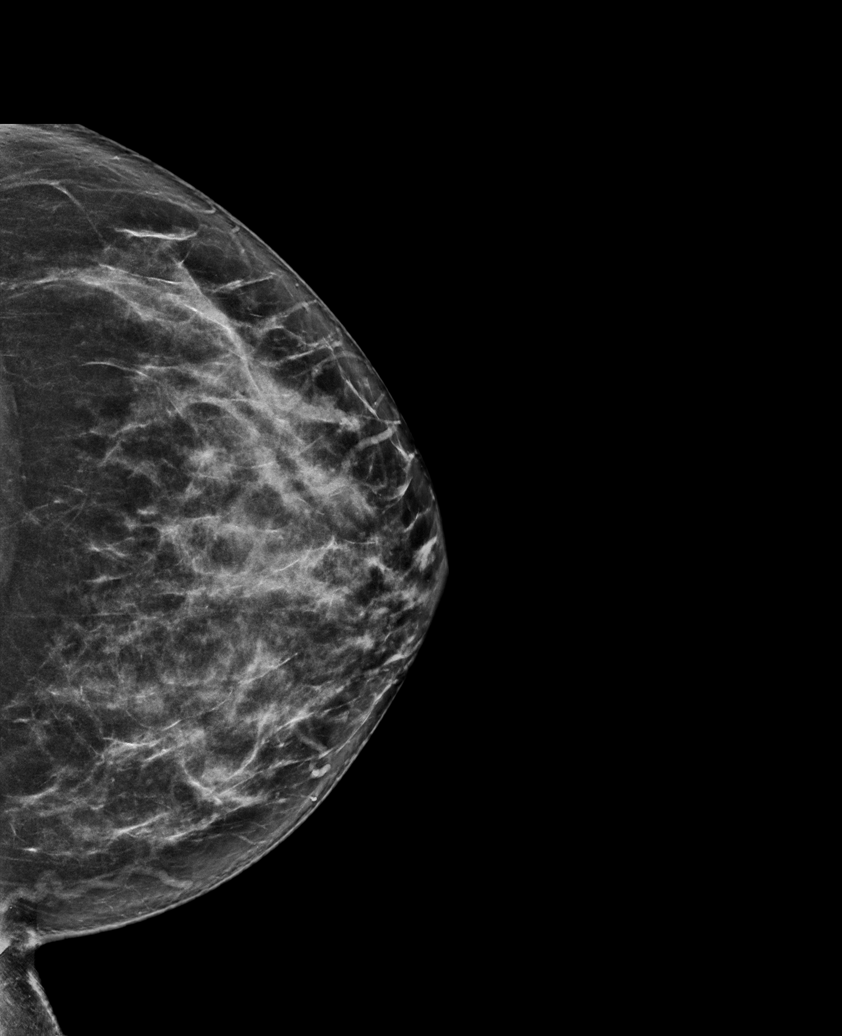

[R TAN tomo · tomo slice 27/53.0]
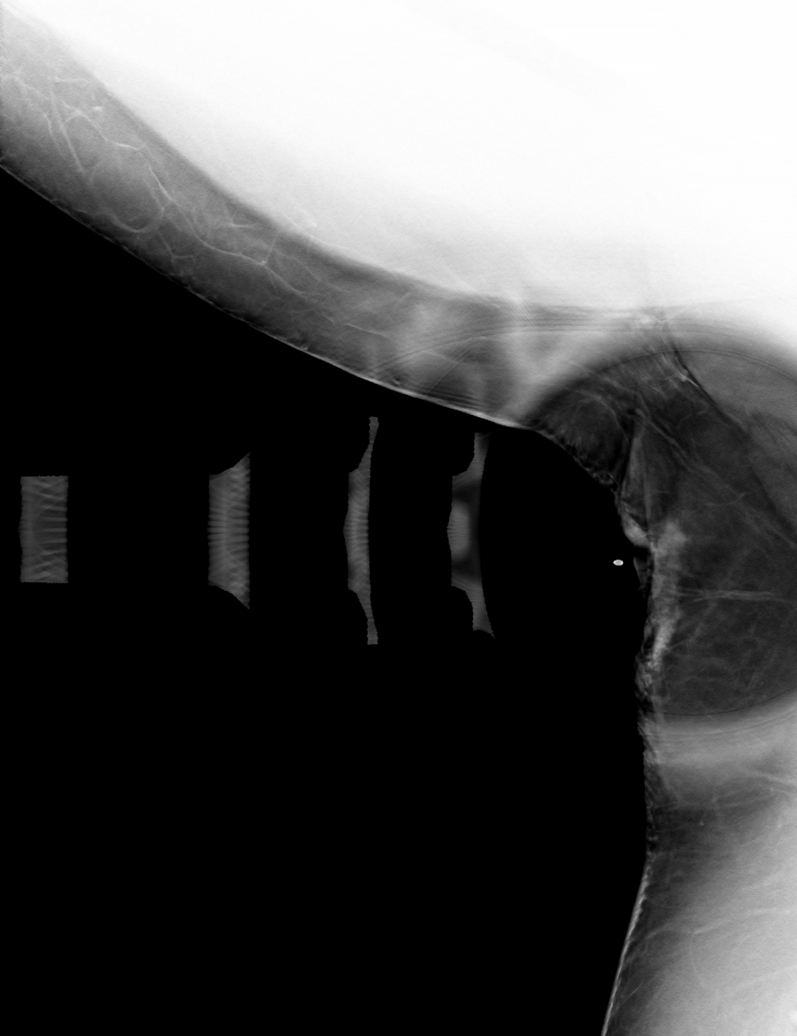

[6 of 30 positions shown; findings below may reference images not displayed]

ACR Breast Density Category c: The breast tissue is heterogeneously
dense, which may obscure small masses.
FINDINGS: No suspicious mass or malignant type microcalcifications identified
in either breast. On the left MLO view there is a prominent left
axillary lymph node. Spot tangential view of the right axilla shows
thickening of the skin with no underlying mass.

Mammographic images were processed with CAD.

On physical exam, I palpate a superficial area of thickening in the
right axilla.

Targeted ultrasound is performed, showing a mixed echogenic
superficial lesion in the right axilla measuring 1.6 x 1.2 x 0.5 cm.
It has peripheral hyperechogenicity and central areas of
hypoechogenicity. There is no internal blood flow. It is difficult
to tell if the lesion is in the dermis and extends subdermally. It
is felt to likely be benign.
IMPRESSION: Probable benign right axillary mass and left axillary adenopathy.

RECOMMENDATION:
Bilateral axillary ultrasound in 3 months is recommended. The
patient was told to return for further imaging evaluation if the
mass in the right axilla is clinically enlarging.

I have discussed the findings and recommendations with the patient.
If applicable, a reminder letter will be sent to the patient
regarding the next appointment.

BI-RADS CATEGORY  3: Probably benign.

## 2021-03-29 IMAGING — US US AXILLARY RIGHT
1 series · 8 of 8 positions shown · non-contrast
Comparison: Previous exam(s).

CLINICAL DATA: 43-year-old female who received her second DP25U-HW
vaccine in the left arm on 06/15/2019. The patient complains of a
palpable superficial stable mass in the right axilla for 2 months.

EXAM:
DIGITAL DIAGNOSTIC BILATERAL MAMMOGRAM WITH CAD AND TOMO
ULTRASOUND LEFT BREAST

[Series 1: us axillary right · 0.05mm/px · 8 of 8 slices shown]
[im 1/8]
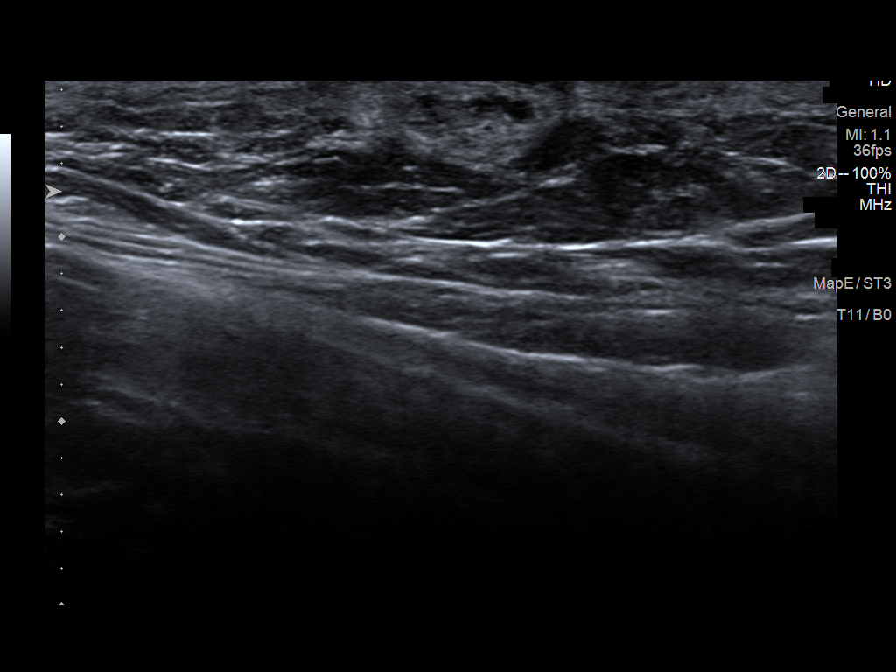
[im 2/8]
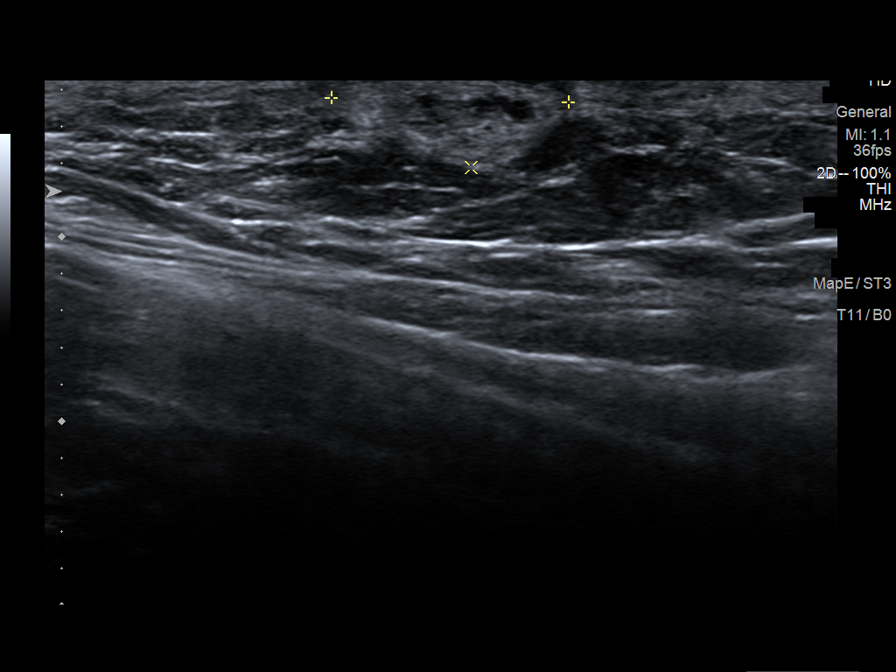
[im 3/8]
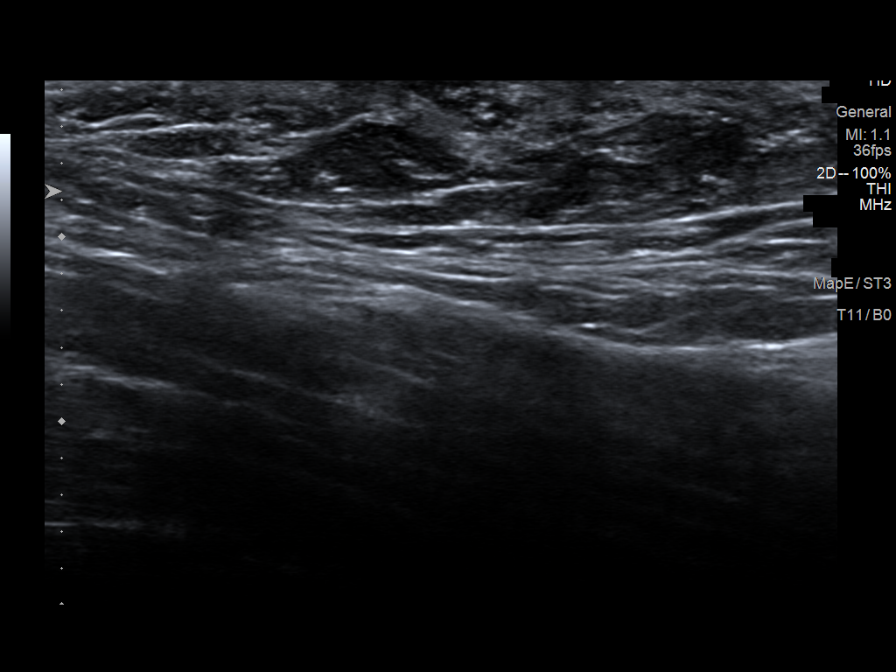
[im 4/8]
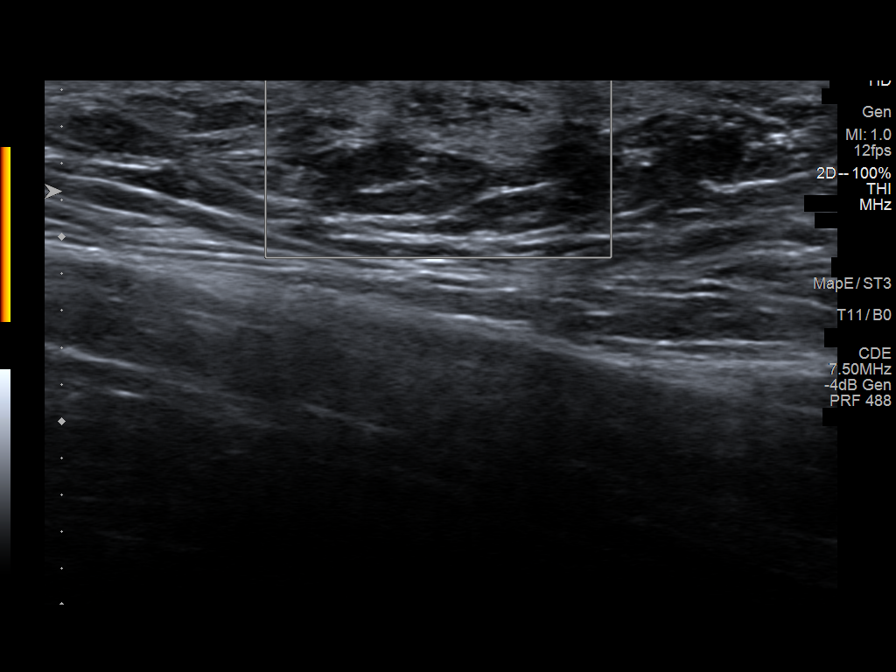
[im 5/8]
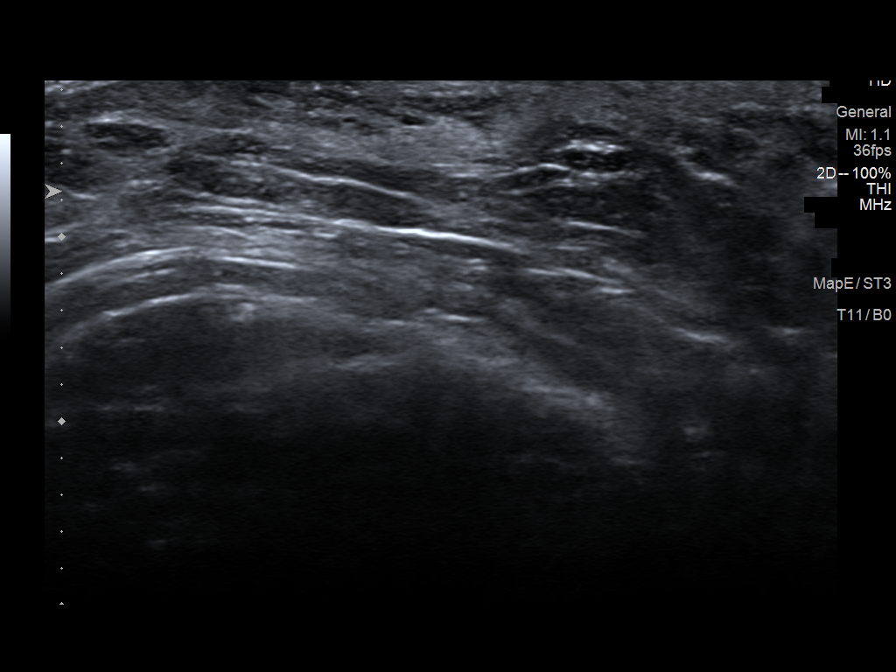
[im 6/8]
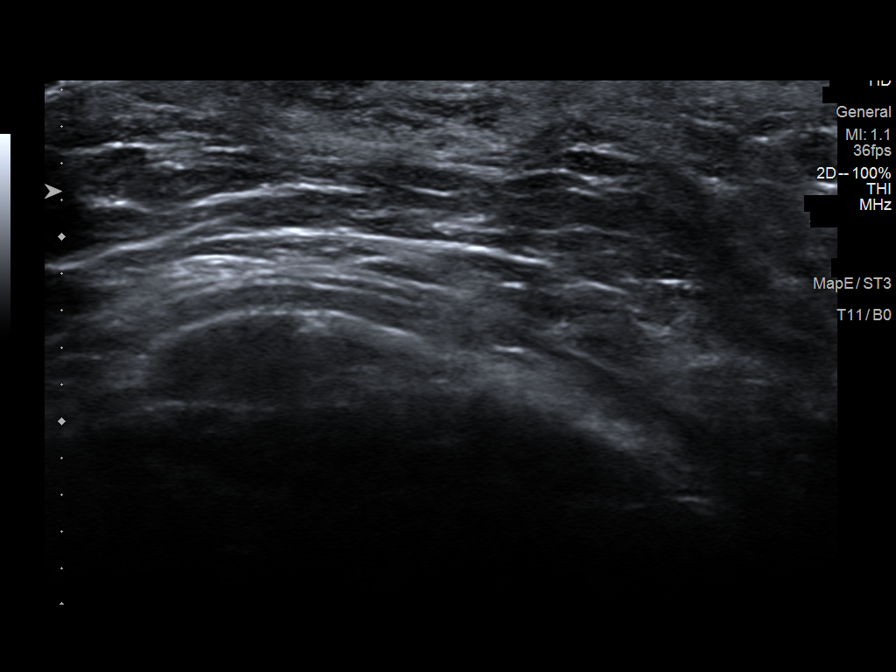
[im 7/8]
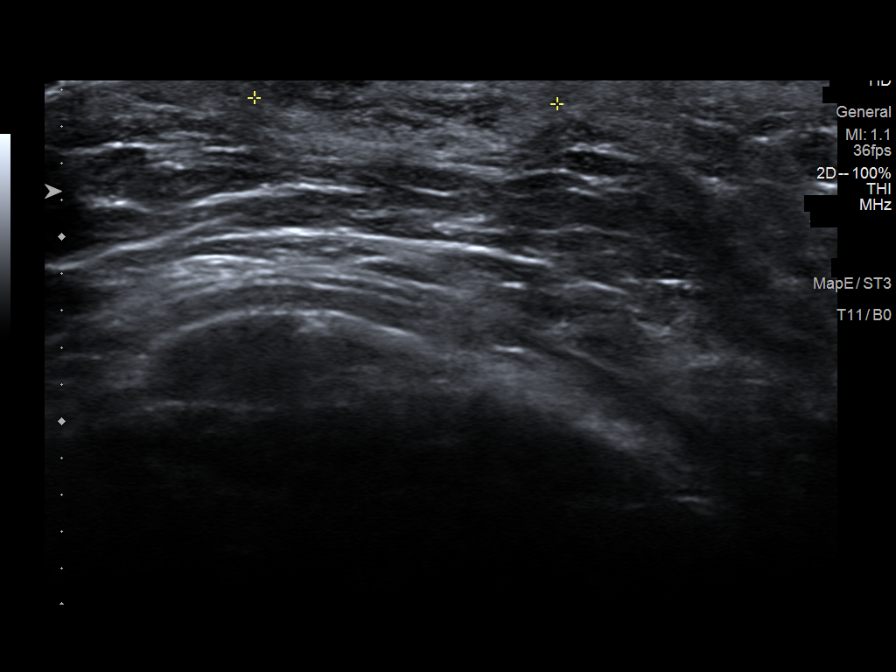
[im 8/8]
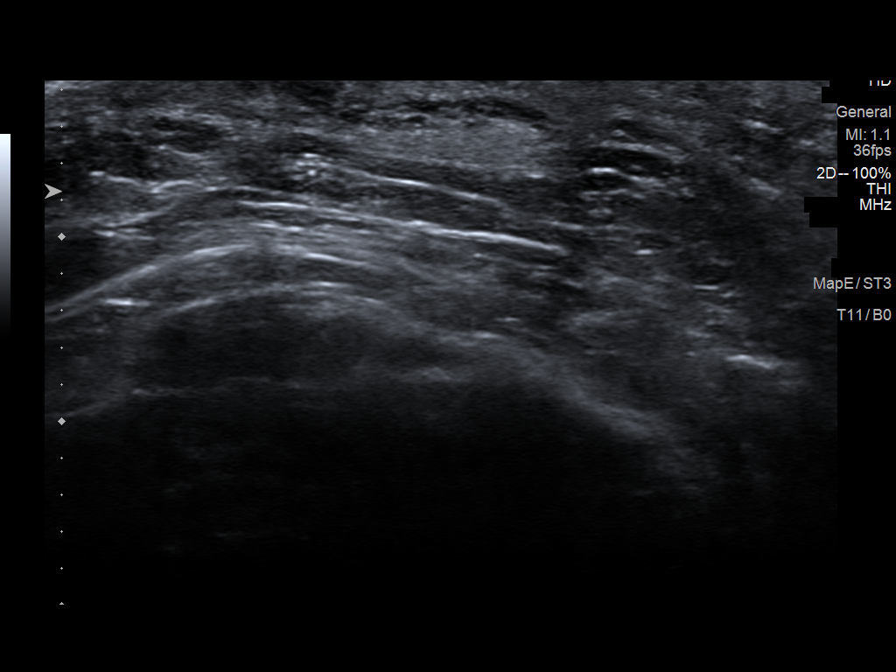

[8 of 8 positions shown; findings below may reference images not displayed]

ACR Breast Density Category c: The breast tissue is heterogeneously
dense, which may obscure small masses.
FINDINGS: No suspicious mass or malignant type microcalcifications identified
in either breast. On the left MLO view there is a prominent left
axillary lymph node. Spot tangential view of the right axilla shows
thickening of the skin with no underlying mass.

Mammographic images were processed with CAD.

On physical exam, I palpate a superficial area of thickening in the
right axilla.

Targeted ultrasound is performed, showing a mixed echogenic
superficial lesion in the right axilla measuring 1.6 x 1.2 x 0.5 cm.
It has peripheral hyperechogenicity and central areas of
hypoechogenicity. There is no internal blood flow. It is difficult
to tell if the lesion is in the dermis and extends subdermally. It
is felt to likely be benign.
IMPRESSION: Probable benign right axillary mass and left axillary adenopathy.

RECOMMENDATION:
Bilateral axillary ultrasound in 3 months is recommended. The
patient was told to return for further imaging evaluation if the
mass in the right axilla is clinically enlarging.

I have discussed the findings and recommendations with the patient.
If applicable, a reminder letter will be sent to the patient
regarding the next appointment.

BI-RADS CATEGORY  3: Probably benign.

## 2021-06-03 ENCOUNTER — Encounter: Payer: Self-pay | Admitting: Gastroenterology

## 2021-06-09 ENCOUNTER — Ambulatory Visit (AMBULATORY_SURGERY_CENTER): Payer: Medicaid Other | Admitting: *Deleted

## 2021-06-09 ENCOUNTER — Other Ambulatory Visit: Payer: Self-pay

## 2021-06-09 VITALS — Ht 62.0 in | Wt 180.0 lb

## 2021-06-09 DIAGNOSIS — Z1211 Encounter for screening for malignant neoplasm of colon: Secondary | ICD-10-CM

## 2021-06-09 MED ORDER — CLENPIQ 10-3.5-12 MG-GM -GM/160ML PO SOLN: 1.0000 | ORAL | 0 refills | Status: DC

## 2021-06-09 NOTE — Progress Notes (Signed)
No egg or soy allergy known to patient  ?No issues known to pt with past sedation with any surgeries or procedures ?Patient denies ever being told they had issues or difficulty with intubation  ?No FH of Malignant Hyperthermia ?Pt is not on diet pills ?Pt is not on  home 02  ?Pt is not on blood thinners  ?Pt denies issues with constipation  ?No A fib or A flutter ? ?Pt is fully vaccinated  for Covid  ? ?CLENPIQ Coupon to pt in PV today , Code to Pharmacy and  NO PA's for preps discussed with pt In PV today  ?Discussed with pt there will be an out-of-pocket cost for prep and that varies from $0 to 70 +  dollars - pt verbalized understanding  ? ?Due to the COVID-19 pandemic we are asking patients to follow certain guidelines in PV and the [place]   ?Pt aware of COVID protocols and [facility] guidelines  ? ?PV completed over the phone. Pt verified name, DOB, address and insurance during PV today.  ?Pt mailed instruction packet with copy of consent form to read and not return, and instructions.  ?Pt encouraged to call with questions or issues.  ?If pt has My chart, procedure instructions sent via My Chart   ? ? ?

## 2021-06-25 ENCOUNTER — Encounter: Payer: Self-pay | Admitting: Gastroenterology

## 2021-06-25 ENCOUNTER — Ambulatory Visit (AMBULATORY_SURGERY_CENTER): Payer: Medicaid Other | Admitting: Gastroenterology

## 2021-06-25 VITALS — BP 105/66 | HR 79 | Temp 97.3°F | Resp 21 | Ht 62.0 in | Wt 180.0 lb

## 2021-06-25 DIAGNOSIS — Z1211 Encounter for screening for malignant neoplasm of colon: Secondary | ICD-10-CM

## 2021-06-25 DIAGNOSIS — D124 Benign neoplasm of descending colon: Secondary | ICD-10-CM

## 2021-06-25 DIAGNOSIS — D123 Benign neoplasm of transverse colon: Secondary | ICD-10-CM

## 2021-06-25 DIAGNOSIS — K5730 Diverticulosis of large intestine without perforation or abscess without bleeding: Secondary | ICD-10-CM

## 2021-06-25 DIAGNOSIS — K640 First degree hemorrhoids: Secondary | ICD-10-CM

## 2021-06-25 MED ORDER — SODIUM CHLORIDE 0.9 % IV SOLN: 500.0000 mL | Freq: Once | INTRAVENOUS | Status: DC

## 2021-06-25 NOTE — Patient Instructions (Signed)
Handouts Provided:  Polyps and Diverticulosis ° °YOU HAD AN ENDOSCOPIC PROCEDURE TODAY AT THE [facility]:   Refer to the procedure report that was given to you for any specific questions about what was found during the examination.  If the procedure report does not answer your questions, please call your gastroenterologist to clarify.  If you requested that your care partner not be given the details of your procedure findings, then the procedure report has been included in a sealed envelope for you to review at your convenience later. ° °YOU SHOULD EXPECT: Some feelings of bloating in the abdomen. Passage of more gas than usual.  Walking can help get rid of the air that was put into your GI tract during the procedure and reduce the bloating. If you had a lower endoscopy (such as a colonoscopy or flexible sigmoidoscopy) you may notice spotting of blood in your stool or on the toilet paper. If you underwent a bowel prep for your procedure, you may not have a normal bowel movement for a few days. ° °Please Note:  You might notice some irritation and congestion in your nose or some drainage.  This is from the oxygen used during your procedure.  There is no need for concern and it should clear up in a day or so. ° °SYMPTOMS TO REPORT IMMEDIATELY: ° °Following lower endoscopy (colonoscopy or flexible sigmoidoscopy): ° Excessive amounts of blood in the stool ° Significant tenderness or worsening of abdominal pains ° Swelling of the abdomen that is new, acute ° Fever of 100°F or higher ° °For urgent or emergent issues, a gastroenterologist can be reached at any hour by calling [phone]. °Do not use MyChart messaging for urgent concerns.  ° ° °DIET:  We do recommend a small meal at first, but then you may proceed to your regular diet.  Drink plenty of fluids but you should avoid alcoholic beverages for 24 hours. ° °ACTIVITY:  You should plan to take it easy for the rest of today and you should NOT DRIVE  or use heavy machinery until tomorrow (because of the sedation medicines used during the test).   ° °FOLLOW UP: °Our staff will call the number listed on your records 48-72 hours following your procedure to check on you and address any questions or concerns that you may have regarding the information given to you following your procedure. If we do not reach you, we will leave a message.  We will attempt to reach you two times.  During this call, we will ask if you have developed any symptoms of COVID 19. If you develop any symptoms (ie: fever, flu-like symptoms, shortness of breath, cough etc.) before then, please call [phone].  If you test positive for Covid 19 in the 2 weeks post procedure, please call and report this information to us.   ° °If any biopsies were taken you will be contacted by phone or by letter within the next 1-3 weeks.  Please call us at [phone] if you have not heard about the biopsies in 3 weeks.  ° ° °SIGNATURES/CONFIDENTIALITY: °You and/or your care partner have signed paperwork which will be entered into your electronic medical record.  These signatures attest to the fact that that the information above on your After Visit Summary has been reviewed and is understood.  Full responsibility of the confidentiality of this discharge information lies with you and/or your care-partner. ° °

## 2021-06-25 NOTE — Progress Notes (Signed)
? ?GASTROENTEROLOGY PROCEDURE H&P NOTE  ? ?Primary Care Physician: ?Leeroy Cha, MD ? ? ? ?Reason for Procedure:  Colon Cancer screening ? ?Plan:    Colonoscopy ? ?Patient is appropriate for endoscopic procedure(s) in the ambulatory ([place]) setting. ? ?The nature of the procedure, as well as the risks, benefits, and alternatives were carefully and thoroughly reviewed with the patient. Ample time for discussion and questions allowed. The patient understood, was satisfied, and agreed to proceed.  ? ? ? ?HPI: ?Alicia Hanson is a 46 y.o. female who presents for colonoscopy for routine Colon Cancer screening.  No active GI symptoms.  ? ?Past Medical History:  ?Diagnosis Date  ? Abnormal Pap smear   ? Colpo  ? Allergy   ? SEASONAL  ? ASCUS (atypical squamous cells of undetermined significance) on Pap smear   ? Asthma   ? CIN I (cervical intraepithelial neoplasia I) 03/28/2005  ? 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 GERD (gastroesophageal reflux disease)   ? H/O varicella   ? High risk HPV infection   ? History of rubella   ? Nausea and vomiting in pregnancy   ? ? ?Past Surgical History:  ?Procedure Laterality Date  ? PARTIAL HYSTERECTOMY    ? 11/30/2018  ? ? ?Prior to Admission medications   ?Medication Sig Start Date End Date Taking? Authorizing Provider  ?levocetirizine (XYZAL) 5 MG tablet Take 1 tablet (5 mg total) by mouth 2 (two) times daily as needed for allergies. 11/19/20  Yes Valentina Shaggy, MD  ?albuterol (PROVENTIL HFA;VENTOLIN HFA) 108 (90 BASE) MCG/ACT inhaler Inhale into the lungs. ?Patient not taking: Reported on 06/25/2021    [provider]  ?beclomethasone (QVAR) 40 MCG/ACT inhaler Inhale 2 puffs into the lungs 2 (two) times daily. ?Patient not taking: Reported on 06/25/2021    [provider]  ?triamcinolone ointment (KENALOG) 0.1 % Apply 1 application topically 2 (two) times daily. ?Patient not taking: Reported on 06/25/2021 11/19/20   Valentina Shaggy, MD  ? ? ?Current  Outpatient Medications  ?Medication Sig Dispense Refill  ? levocetirizine (XYZAL) 5 MG tablet Take 1 tablet (5 mg total) by mouth 2 (two) times daily as needed for allergies. 60 tablet 5  ? albuterol (PROVENTIL HFA;VENTOLIN HFA) 108 (90 BASE) MCG/ACT inhaler Inhale into the lungs. (Patient not taking: Reported on 06/25/2021)    ? beclomethasone (QVAR) 40 MCG/ACT inhaler Inhale 2 puffs into the lungs 2 (two) times daily. (Patient not taking: Reported on 06/25/2021)    ? triamcinolone ointment (KENALOG) 0.1 % Apply 1 application topically 2 (two) times daily. (Patient not taking: Reported on 06/25/2021) 80 g 0  ? ?Current Facility-Administered Medications  ?Medication Dose Route Frequency Provider Last Rate Last Admin  ? 0.9 %  sodium chloride infusion  500 mL Intravenous Once Margi Edmundson V, DO      ? ? ?Allergies as of 06/25/2021 - Review Complete 06/25/2021  ?Allergen Reaction Noted  ? Oxycodone-acetaminophen Hives, Itching, and Nausea And Vomiting 02/05/2015  ? ? ?Family History  ?Problem Relation Age of Onset  ? Diabetes Mother   ? Hypertension Father   ? Diabetes Father   ? Breast cancer Neg Hx   ? Colon cancer Neg Hx   ? Colon polyps Neg Hx   ? Esophageal cancer Neg Hx   ? Rectal cancer Neg Hx   ? Stomach cancer Neg Hx   ? ? ?Social History  ? ?Socioeconomic History  ? Marital status: Single  ?  Spouse name: Not  on file  ? Number of children: Not on file  ? Years of education: Not on file  ? Highest education level: Not on file  ?Occupational History  ? Not on file  ?Tobacco Use  ? Smoking status: Never  ?  Passive exposure: Never  ? Smokeless tobacco: Never  ?Vaping Use  ? Vaping Use: Never used  ?Substance and Sexual Activity  ? Alcohol use: Yes  ?  Comment: RARELY  ? Drug use: No  ? Sexual activity: Yes  ?Other Topics Concern  ? Not on file  ?Social History Narrative  ? Not on file  ? ?Social Determinants of Health  ? ?Financial Resource Strain: Not on file  ?Food Insecurity: Not on file  ?Transportation  Needs: Not on file  ?Physical Activity: Not on file  ?Stress: Not on file  ?Social Connections: Not on file  ?Intimate Partner Violence: Not on file  ? ? ?Physical Exam: ?Vital signs in last 24 hours: ?'@BP'$  110/72   Pulse 79   Temp (!) 97.3 ?F (36.3 ?C)   Ht '5\' 2"'$  (1.575 m)   Wt 180 lb (81.6 kg)   SpO2 97%   BMI 32.92 kg/m?  ?GEN: NAD ?EYE: Sclerae anicteric ?ENT: MMM ?CV: Non-tachycardic ?Pulm: CTA b/l ?GI: Soft, NT/ND ?NEURO:  Alert & Oriented x 3 ? ? ?Gerrit Heck, DO ?[facility] ? ? ?06/25/2021 2:50 PM ? ?

## 2021-06-25 NOTE — Op Note (Signed)
[place] ?Patient Name: Alicia Hanson ?Procedure Date: 06/25/2021 2:40 PM ?MRN: [id number] ?Endoscopist: Gerrit Heck , MD ?Age: 46 ?Referring MD:  ?Date of Birth: 1975-06-28 ?Gender: Female ?Account #: [id number] ?Procedure:                Colonoscopy ?Indications:              Screening for colorectal malignant neoplasm, This  ?                          is the patient's first colonoscopy ?Medicines:                Monitored Anesthesia Care ?Procedure:                Pre-Anesthesia Assessment: ?                          - Prior to the procedure, a History and Physical  ?                          was performed, and patient medications and  ?                          allergies were reviewed. The patient's tolerance of  ?                          previous anesthesia was also reviewed. The risks  ?                          and benefits of the procedure and the sedation  ?                          options and risks were discussed with the patient.  ?                          All questions were answered, and informed consent  ?                          was obtained. Prior Anticoagulants: The patient has  ?                          taken no previous anticoagulant or antiplatelet  ?                          agents. ASA Grade Assessment: II - A patient with  ?                          mild systemic disease. After reviewing the risks  ?                          and benefits, the patient was deemed in  ?                          satisfactory condition to undergo the procedure. ?  After obtaining informed consent, the colonoscope  ?                          was passed under direct vision. Throughout the  ?                          procedure, the patient's blood pressure, pulse, and  ?                          oxygen saturations were monitored continuously. The  ?                          CF [id number] #[id number] was introduced through the anus  ?                          and advanced to the  the cecum, identified by  ?                          appendiceal orifice and ileocecal valve. The  ?                          colonoscopy was performed without difficulty. The  ?                          patient tolerated the procedure well. The quality  ?                          of the bowel preparation was good. The ileocecal  ?                          valve, appendiceal orifice, and rectum were  ?                          photographed. ?Scope In: 2:56:34 PM ?Scope Out: 3:13:34 PM ?Scope Withdrawal Time: 0 hours 14 minutes 2 seconds  ?Total Procedure Duration: 0 hours 17 minutes 0 seconds  ?Findings:                 Hemorrhoids were found on perianal exam. ?                          Two sessile polyps were found in the descending  ?                          colon and transverse colon. The polyps were 2 to 5  ?                          mm in size. These polyps were removed with a cold  ?                          snare. Resection and retrieval were complete.  ?                          Estimated blood loss was minimal. ?  Multiple small-mouthed diverticula were found in  ?                          the sigmoid colon, descending colon and transverse  ?                          colon. ?                          Retroflexion in the rectum was not performed due to  ?                          anatomy (narrow, shallow rectal vault). ?                          Non-bleeding internal hemorrhoids and hypertrophied  ?                          anal papillae were found. The hemorrhoids were  ?                          small. ?Complications:            No immediate complications. ?Estimated Blood Loss:     Estimated blood loss was minimal. ?Impression:               - Hemorrhoids found on perianal exam. ?                          - Two 2 to 5 mm polyps in the descending colon and  ?                          in the transverse colon, removed with a cold snare.  ?                          Resected and  retrieved. ?                          - Diverticulosis in the sigmoid colon, in the  ?                          descending colon and in the transverse colon. ?                          - Non-bleeding internal hemorrhoids. ?                          - Small hypertrophied anal papillae. ?Recommendation:           - Patient has a contact number available for  ?                          emergencies. The signs and symptoms of potential  ?                          delayed complications were discussed with the  ?  patient. Return to normal activities tomorrow.  ?                          Written discharge instructions were provided to the  ?                          patient. ?                          - Resume previous diet. ?                          - Continue present medications. ?                          - Await pathology results. ?                          - Repeat colonoscopy for surveillance based on  ?                          pathology results. ?                          - Return to GI clinic PRN. ?Gerrit Heck, MD ?06/25/2021 3:18:22 PM ?

## 2021-06-25 NOTE — Progress Notes (Signed)
Called to room to assist during endoscopic procedure.  Patient ID and intended procedure confirmed with present staff. Received instructions for my participation in the procedure from the performing physician.  

## 2021-06-25 NOTE — Progress Notes (Signed)
Pt. Reports no change in her medical or surgical history since her pre-visit 06/09/2021. ?

## 2021-06-25 NOTE — Progress Notes (Signed)
Report to PACU, RN, vss, BBS= Clear.  

## 2021-06-29 ENCOUNTER — Telehealth: Payer: Self-pay

## 2021-06-29 NOTE — Telephone Encounter (Signed)
?  Follow up Call- ? ? ?  06/25/2021  ?  2:40 PM  ?Call back number  ?Post procedure Call Back phone  # [phone]  ?Permission to leave phone message Yes  ?  ? ?Patient questions: ? ?Do you have a fever, pain , or abdominal swelling? No. ?Pain Score  0 * ? ?Have you tolerated food without any problems? Yes.   ? ?Have you been able to return to your normal activities? Yes.   ? ?Do you have any questions about your discharge instructions: ?Diet   No. ?Medications  No. ?Follow up visit  No. ? ?Do you have questions or concerns about your Care? No. ? ?Actions: ?* If pain score is 4 or above: ?No action needed, pain <4. ? ? ?

## 2021-07-09 ENCOUNTER — Encounter: Payer: Self-pay | Admitting: Gastroenterology

## 2021-08-20 ENCOUNTER — Other Ambulatory Visit: Payer: Self-pay | Admitting: Obstetrics and Gynecology

## 2021-08-20 DIAGNOSIS — Z1231 Encounter for screening mammogram for malignant neoplasm of breast: Secondary | ICD-10-CM

## 2021-09-08 ENCOUNTER — Ambulatory Visit: Payer: Medicaid Other

## 2021-09-09 ENCOUNTER — Ambulatory Visit
Admission: RE | Admit: 2021-09-09 | Discharge: 2021-09-09 | Disposition: A | Discharge status: Home / Self Care | Payer: Medicaid Other | Source: Ambulatory Visit | Attending: Obstetrics and Gynecology | Admitting: Obstetrics and Gynecology

## 2021-09-09 DIAGNOSIS — Z1231 Encounter for screening mammogram for malignant neoplasm of breast: Secondary | ICD-10-CM

## 2021-12-22 ENCOUNTER — Ambulatory Visit (INDEPENDENT_AMBULATORY_CARE_PROVIDER_SITE_OTHER): Payer: BC Managed Care – PPO | Admitting: Family Medicine

## 2021-12-22 ENCOUNTER — Encounter: Payer: Self-pay | Admitting: Family Medicine

## 2021-12-22 VITALS — BP 120/78 | HR 88 | Temp 98.1°F | Resp 16 | Ht 62.0 in | Wt 184.8 lb

## 2021-12-22 DIAGNOSIS — H6982 Other specified disorders of Eustachian tube, left ear: Secondary | ICD-10-CM | POA: Diagnosis not present

## 2021-12-22 DIAGNOSIS — J310 Chronic rhinitis: Secondary | ICD-10-CM | POA: Diagnosis not present

## 2021-12-22 DIAGNOSIS — J4530 Mild persistent asthma, uncomplicated: Secondary | ICD-10-CM

## 2021-12-22 DIAGNOSIS — K9049 Malabsorption due to intolerance, not elsewhere classified: Secondary | ICD-10-CM

## 2021-12-22 MED ORDER — LEVOCETIRIZINE DIHYDROCHLORIDE 5 MG PO TABS
5.0000 mg | ORAL_TABLET | Freq: Every day | ORAL | 5 refills | Status: DC | PRN
Start: 2021-12-22 — End: 2022-09-01

## 2021-12-22 MED ORDER — ALBUTEROL SULFATE HFA 108 (90 BASE) MCG/ACT IN AERS: 2.0000 | INHALATION_SPRAY | RESPIRATORY_TRACT | 2 refills | Status: DC | PRN

## 2021-12-22 NOTE — Patient Instructions (Addendum)
Asthma Continue albuterol 2 puffs once every 4 hours as needed for cough or wheeze You may use albuterol 2 puffs 5 to 15 minutes before activity to decrease cough or wheeze For asthma flare, begin AirDuo 232-1 puff twice a day for 2 weeks or until cough and wheeze free  Chronic rhinitis Continue Xyzal 5 mg once a day as needed for runny nose or itch.  You may take an additional dose of Xyzal 5 mg once a day as needed for breakthrough symptoms. Remember to rotate to a different antihistamine about every 3 months. Some examples of over the counter antihistamines include Zyrtec (cetirizine), Xyzal (levocetirizine), Allegra (fexofenadine), and Claritin (loratidine).  Begin Flonase 2 sprays in each nostril twice a day as needed for a stuffy nose.  In the right nostril, point the applicator out toward the right ear. In the left nostril, point the applicator out toward the left ear Begin saline nasal rinses as needed for nasal symptoms. Use this before any medicated nasal sprays for best result  Eustachian tube dysfunction Begin nasal saline rinses followed by nasal steroid spray (Flonase) daily.  May need ENT or audiology consult for intermittent hearing changes  Food intolerance/oral allergy syndrome Food allergy testing was negative to strawberry and pineapple.  You may introduce these foods at home if desired. You may take an additional dose of antihistamine if needed before or after eating these foods.   Call the clinic if this treatment plan is not working well for you.  Follow up in 6 months or sooner if needed.

## 2021-12-22 NOTE — Progress Notes (Signed)
[place] [address] Dept: [phone]  FOLLOW UP NOTE  Patient ID: Alicia Hanson, female    DOB: November 02, 1975  Age: 46 y.o. MRN: [id number] Date of Office Visit: 12/22/2021  Assessment  Chief Complaint: Ear Fullness (Left ear)  HPI Alicia Hanson is a 46 year old female who presents the clinic for follow-up visit.  She was last seen in this clinic as a new patient on 11/17/2020 by Dr. Ernst Bowler for evaluation of asthma, chronic rhinitis, and food allergy versus oral allergy syndrome.  She reports her asthma has been moderately well controlled with symptoms occurring during the weather change including slight wheeze during the daytime only and cough producing mucus that began in about August and has worsened over the last couple of weeks.  She continues albuterol once or twice a day over the last 3 days and is using Armonair 232 only as needed.  Allergic rhinitis is reported as moderately well controlled with clear rhinorrhea, nasal congestion occurring at night, sneezing at night, and copious postnasal drainage.  She reports occasional sore throat due to phlegm production.  She is currently taking Xyzal 5 mg once a day and is not using nasal saline rinse or steroid nasal spray.  Her environmental allergy skin testing on 11/17/2020 was negative to the adult environmental panel.  Follow-up blood work indicates negative environmental allergies and negative food allergies.  She continues to avoid strawberry and pineapple as she reports these foods make her mouth and her body itch.  She reports bilateral ear fullness and intermittent muffled hearing at the left ear.  She denies ear pain.  Her current medications are listed in the chart.  Drug Allergies:  Allergies  Allergen Reactions   Oxycodone-Acetaminophen Hives, Itching and Nausea And Vomiting    Physical Exam: BP 120/78   Pulse 88   Temp 98.1 F (36.7 C) (Temporal)   Resp 16   Ht '5\' 2"'$  (1.575 m)   Wt 184 lb 12.8 oz  (83.8 kg)   SpO2 99%   BMI 33.80 kg/m    Physical Exam Vitals reviewed.  Constitutional:      Appearance: Normal appearance.  HENT:     Head: Normocephalic and atraumatic.     Ears:     Comments: Clear effusion noted left ear.  Right TM normal.  Bilateral canals normal.    Nose:     Comments: Lateral nares edematous and pale with clear nasal drainage.  Pharynx slightly erythematous with no exudate.  Eyes normal. Eyes:     Conjunctiva/sclera: Conjunctivae normal.  Cardiovascular:     Rate and Rhythm: Normal rate and regular rhythm.     Heart sounds: Normal heart sounds. No murmur heard. Pulmonary:     Effort: Pulmonary effort is normal.     Breath sounds: Normal breath sounds.     Comments: Lungs clear to auscultation Musculoskeletal:        General: Normal range of motion.     Cervical back: Normal range of motion and neck supple.  Skin:    General: Skin is warm and dry.  Neurological:     Mental Status: She is alert and oriented to person, place, and time.  Psychiatric:        Mood and Affect: Mood normal.        Behavior: Behavior normal.        Thought Content: Thought content normal.        Judgment: Judgment normal.     Diagnostics: FVC  2.60, FEV1 2.24.  Predicted FVC 2.83, predicted FEV1 2.31.  Spirometry indicates normal ventilatory function.  Assessment and Plan: 1. Mild persistent asthma, uncomplicated   2. Chronic rhinitis   3. Food intolerance   4. Dysfunction of left eustachian tube     Meds ordered this encounter  Medications   albuterol (VENTOLIN HFA) 108 (90 Base) MCG/ACT inhaler    Sig: Inhale 2 puffs into the lungs every 4 (four) hours as needed for wheezing or shortness of breath.    Dispense:  1 each    Refill:  2   levocetirizine (XYZAL) 5 MG tablet    Sig: Take 1 tablet (5 mg total) by mouth daily as needed for allergies.    Dispense:  30 tablet    Refill:  5    Patient Instructions  Asthma Continue albuterol 2 puffs once every 4  hours as needed for cough or wheeze You may use albuterol 2 puffs 5 to 15 minutes before activity to decrease cough or wheeze For asthma flare, begin AirDuo 232-1 puff twice a day for 2 weeks or until cough and wheeze free  Chronic rhinitis Continue Xyzal 5 mg once a day as needed for runny nose or itch.  You may take an additional dose of Xyzal 5 mg once a day as needed for breakthrough symptoms. Remember to rotate to a different antihistamine about every 3 months. Some examples of over the counter antihistamines include Zyrtec (cetirizine), Xyzal (levocetirizine), Allegra (fexofenadine), and Claritin (loratidine).  Begin Flonase 2 sprays in each nostril twice a day as needed for a stuffy nose.  In the right nostril, point the applicator out toward the right ear. In the left nostril, point the applicator out toward the left ear Begin saline nasal rinses as needed for nasal symptoms. Use this before any medicated nasal sprays for best result  Eustachian tube dysfunction Begin nasal saline rinses followed by nasal steroid spray (Flonase) daily.  May need ENT or audiology consult for intermittent hearing changes  Food intolerance/oral allergy syndrome Food allergy testing was negative to strawberry and pineapple.  You may introduce these foods at home if desired. You may take an additional dose of antihistamine if needed before or after eating these foods.   Call the clinic if this treatment plan is not working well for you.  Follow up in 6 months or sooner if needed.  Return in about 6 months (around 06/22/2022), or if symptoms worsen or fail to improve.    Thank you for the opportunity to care for this patient.  Please do not hesitate to contact me with questions.  Gareth Morgan, FNP [facility]

## 2022-02-12 ENCOUNTER — Other Ambulatory Visit: Payer: Self-pay | Admitting: Allergy & Immunology

## 2022-04-05 ENCOUNTER — Ambulatory Visit: Payer: BC Managed Care – PPO | Admitting: Podiatry

## 2022-04-07 ENCOUNTER — Ambulatory Visit (INDEPENDENT_AMBULATORY_CARE_PROVIDER_SITE_OTHER): Payer: BC Managed Care – PPO | Admitting: Podiatry

## 2022-04-07 DIAGNOSIS — M722 Plantar fascial fibromatosis: Secondary | ICD-10-CM | POA: Diagnosis not present

## 2022-04-07 NOTE — Progress Notes (Signed)
Subjective:  Patient ID: Alicia Hanson, female    DOB: 1976-01-06,  MRN: [id number]  Chief Complaint  Patient presents with   Plantar Fasciitis    Pt stated that she saw dr regal in the past and was told she had plantar fasciitis was given an injection which helped a lot     47 y.o. female presents with the above complaint.  Patient presents with bilateral heel pain that she has seen Dr. Paulla Dolly in the past.  She states that the injection helped a lot back in the day.  She would like to discuss another injection.  She denies any other acute complaints.  She states the pain scale have gotten worse 7 out of 10 dull achy in nature hurts with ambulation hurts with pressure.   Review of Systems: Negative except as noted in the HPI. Denies N/V/F/Ch.  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 (Patient not taking: Reported on 06/25/2021), Disp: , Rfl:    levocetirizine (XYZAL) 5 MG tablet, Take 1 tablet (5 mg total) by mouth daily as needed for allergies., Disp: 30 tablet, Rfl: 5   SUMAtriptan (IMITREX) 50 MG tablet, Take by mouth., Disp: , Rfl:    triamcinolone ointment (KENALOG) 0.1 %, Apply 1 application topically 2 (two) times daily. (Patient not taking: Reported on 06/25/2021), Disp: 80 g, Rfl: 0   valACYclovir (VALTREX)  1000 MG tablet, TAKE 1 TABLET Every 8 Hours for 7 days, Disp: , Rfl:   Social History   Tobacco Use  Smoking Status Never   Passive exposure: Never  Smokeless Tobacco Never    Allergies  Allergen Reactions   Oxycodone-Acetaminophen Hives, Itching and Nausea And Vomiting   Objective:  There were no vitals filed for this visit. There is no height or weight on file to calculate BMI. Constitutional Well developed. Well nourished.  Vascular Dorsalis pedis pulses palpable bilaterally. Posterior tibial pulses palpable bilaterally. Capillary refill normal to all digits.  No cyanosis or clubbing noted. Pedal hair growth normal.  Neurologic Normal speech. Oriented to person, place, and time. Epicritic sensation to light touch grossly present bilaterally.  Dermatologic Nails well groomed and normal in appearance. No open wounds. No skin lesions.  Orthopedic: Normal joint ROM without pain or crepitus bilaterally. No visible deformities. Tender to palpation at the calcaneal tuber bilaterally. No pain with calcaneal squeeze bilaterally. Ankle ROM diminished range of motion bilaterally. Silfverskiold Test: positive bilaterally.   Radiographs: None  Assessment:   1. Plantar fasciitis of right foot   2. Plantar fasciitis of left foot    Plan:  Patient was evaluated and treated and all questions answered.  Plantar Fasciitis, bilaterally - XR reviewed as above.  - Educated on  icing and stretching. Instructions given.  - Injection delivered to the plantar fascia as below. - DME: Plantar fascial brace dispensed to support the medial longitudinal arch of the foot and offload pressure from the heel and prevent arch collapse during weightbearing - Pharmacologic management: None  Procedure: Injection Tendon/Ligament Location: Bilateral plantar fascia at the glabrous junction; medial approach. Skin Prep: alcohol Injectate: 0.5 cc 0.5% marcaine plain, 0.5 cc of 1% Lidocaine, 0.5 cc  kenalog 10. Disposition: Patient tolerated procedure well. Injection site dressed with a band-aid.  No follow-ups on file.

## 2022-05-05 ENCOUNTER — Ambulatory Visit (INDEPENDENT_AMBULATORY_CARE_PROVIDER_SITE_OTHER): Payer: BC Managed Care – PPO | Admitting: Podiatry

## 2022-05-05 DIAGNOSIS — M722 Plantar fascial fibromatosis: Secondary | ICD-10-CM

## 2022-05-05 NOTE — Progress Notes (Signed)
  Subjective:  Patient ID: Alicia Hanson, female    DOB: 29-Oct-1975,  MRN: [id number]  Chief Complaint  Patient presents with   Plantar Fasciitis    47 y.o. female presents with the above complaint.  Patient presents for follow-up of bilateral Planter fasciitis.  She states the injection did not help much.  She wanted to get it evaluated.  She would like to discuss next treatment plan she does not remove another injection   Review of Systems: Negative except as noted in the HPI. Denies N/V/F/Ch.  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 (Patient not taking: Reported on 06/25/2021), Disp: , Rfl:    levocetirizine (XYZAL) 5 MG tablet, Take 1 tablet (5 mg total) by mouth daily as needed for allergies., Disp: 30 tablet, Rfl: 5   SUMAtriptan (IMITREX) 50 MG tablet, Take by mouth., Disp: , Rfl:    triamcinolone ointment (KENALOG) 0.1 %, Apply 1 application topically 2 (two) times daily. (Patient not taking: Reported on 06/25/2021), Disp: 80 g, Rfl: 0   valACYclovir (VALTREX) 1000 MG tablet, TAKE 1 TABLET Every 8 Hours for 7 days, Disp: , Rfl:   Social History   Tobacco Use  Smoking Status Never   Passive exposure: Never  Smokeless Tobacco Never    Allergies  Allergen Reactions   Oxycodone-Acetaminophen Hives,  Itching and Nausea And Vomiting   Objective:  There were no vitals filed for this visit. There is no height or weight on file to calculate BMI. Constitutional Well developed. Well nourished.  Vascular Dorsalis pedis pulses palpable bilaterally. Posterior tibial pulses palpable bilaterally. Capillary refill normal to all digits.  No cyanosis or clubbing noted. Pedal hair growth normal.  Neurologic Normal speech. Oriented to person, place, and time. Epicritic sensation to light touch grossly present bilaterally.  Dermatologic Nails well groomed and normal in appearance. No open wounds. No skin lesions.  Orthopedic: Normal joint ROM without pain or crepitus bilaterally. No visible deformities. Tender to palpation at the calcaneal tuber bilaterally. No pain with calcaneal squeeze bilaterally. Ankle ROM diminished range of motion bilaterally. Silfverskiold Test: positive bilaterally.   Radiographs: None  Assessment:   No diagnosis found.  Plan:  Patient was evaluated and treated and all questions answered.  Plantar Fasciitis, bilaterally - XR reviewed as above.  - Educated on icing and stretching. Instructions given.  -Will hold off on injection as they have not helped - DME: I will place her in a cam boot to the left side.  I also believe patient will benefit from bilateral night splint bilateral night splints were dispensed - Pharmacologic management: None   No follow-ups on file.

## 2022-05-09 ENCOUNTER — Telehealth: Payer: Self-pay | Admitting: Podiatry

## 2022-05-09 NOTE — Telephone Encounter (Signed)
error 

## 2022-05-31 ENCOUNTER — Encounter: Payer: Self-pay | Admitting: Podiatry

## 2022-05-31 ENCOUNTER — Ambulatory Visit (INDEPENDENT_AMBULATORY_CARE_PROVIDER_SITE_OTHER): Payer: BC Managed Care – PPO | Admitting: Podiatry

## 2022-05-31 DIAGNOSIS — M722 Plantar fascial fibromatosis: Secondary | ICD-10-CM | POA: Diagnosis not present

## 2022-05-31 DIAGNOSIS — G43009 Migraine without aura, not intractable, without status migrainosus: Secondary | ICD-10-CM | POA: Insufficient documentation

## 2022-05-31 DIAGNOSIS — A6004 Herpesviral vulvovaginitis: Secondary | ICD-10-CM | POA: Insufficient documentation

## 2022-05-31 NOTE — Progress Notes (Signed)
Subjective:  Patient ID: Alicia Hanson, female    DOB: 06-Oct-1975,  MRN: [id number]  Chief Complaint  Patient presents with   Plantar Fasciitis    Pt stated that she has good days and bad days     47 y.o. female presents with the above complaint.  Patient presents for follow-up of bilateral Planter fasciitis.  She states cam boot did not help much.  She still has about the same pain.  She would like to discuss next treatment plan   Review of Systems: Negative except as noted in the HPI. Denies N/V/F/Ch.  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 (Patient not taking: Reported on 06/25/2021), Disp: , Rfl:    ibuprofen (ADVIL) 200 MG tablet, 1 tablet with food or milk as needed Orally Three times a day, Disp: , Rfl:    levocetirizine (XYZAL) 5 MG tablet, Take 1 tablet (5 mg total) by mouth daily as needed for allergies., Disp: 30 tablet, Rfl: 5   SUMAtriptan (IMITREX) 50 MG tablet, Take by mouth., Disp: , Rfl:    triamcinolone ointment (KENALOG) 0.1 %, Apply 1 application topically 2 (two) times daily. (Patient not taking: Reported on 06/25/2021), Disp: 80 g, Rfl: 0   valACYclovir (VALTREX) 1000 MG tablet, TAKE 1 TABLET  Every 8 Hours for 7 days, Disp: , Rfl:   Social History   Tobacco Use  Smoking Status Never   Passive exposure: Never  Smokeless Tobacco Never    Allergies  Allergen Reactions   Oxycodone-Acetaminophen Hives, Itching and Nausea And Vomiting   Oxycodone     Other Reaction(s): Unknown   Objective:  There were no vitals filed for this visit. There is no height or weight on file to calculate BMI. Constitutional Well developed. Well nourished.  Vascular Dorsalis pedis pulses palpable bilaterally. Posterior tibial pulses palpable bilaterally. Capillary refill normal to all digits.  No cyanosis or clubbing noted. Pedal hair growth normal.  Neurologic Normal speech. Oriented to person, place, and time. Epicritic sensation to light touch grossly present bilaterally.  Dermatologic Nails well groomed and normal in appearance. No open wounds. No skin lesions.  Orthopedic: Normal joint ROM without pain or crepitus bilaterally. No visible deformities. Tender to palpation at the calcaneal tuber bilaterally. No pain with calcaneal squeeze bilaterally. Ankle ROM diminished range of motion bilaterally. Silfverskiold Test: positive bilaterally.   Radiographs: None  Assessment:   No diagnosis found.  Plan:  Patient was evaluated and treated and all questions answered.  Plantar Fasciitis, bilaterally - XR reviewed as above.  - Educated on icing and stretching. Instructions given.  -Will hold  off on injection as they have not helped - DME: Continue cam boot as needed. - Pharmacologic management: None -Will try to do physical therapy for 6 weeks to see if there is any improvement.  Physical therapy prescription was given  No follow-ups on file.

## 2022-06-02 ENCOUNTER — Ambulatory Visit: Payer: BC Managed Care – PPO | Admitting: Podiatry

## 2022-07-12 ENCOUNTER — Ambulatory Visit (INDEPENDENT_AMBULATORY_CARE_PROVIDER_SITE_OTHER): Payer: BC Managed Care – PPO | Admitting: Podiatry

## 2022-07-12 DIAGNOSIS — M722 Plantar fascial fibromatosis: Secondary | ICD-10-CM | POA: Diagnosis not present

## 2022-07-12 MED ORDER — MELOXICAM 15 MG PO TABS: 15.0000 mg | ORAL_TABLET | Freq: Every day | ORAL | 0 refills | Status: DC

## 2022-07-12 MED ORDER — METHYLPREDNISOLONE 4 MG PO TBPK: ORAL_TABLET | ORAL | 0 refills | Status: DC

## 2022-07-12 NOTE — Progress Notes (Signed)
Subjective:  Patient ID: Alicia Hanson, female    DOB: 03-May-1975,  MRN: [id number]  Chief Complaint  Patient presents with   Plantar Fasciitis    Pt stated that she has her days     47 y.o. female presents with the above complaint.  Patient presents for follow-up of bilateral Planter fasciitis.  She states physical therapy is helping but has not gotten any of her pain.  She would like to discuss next treatment plan   Review of Systems: Negative except as noted in the HPI. Denies N/V/F/Ch.  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/28/2008   GERD (gastroesophageal reflux disease)    H/O varicella    High risk HPV infection    History of rubella    Nausea and vomiting in pregnancy     Current Outpatient Medications:    meloxicam (MOBIC) 15 MG tablet, Take 1 tablet (15 mg total) by mouth daily., Disp: 30 tablet, Rfl: 0   methylPREDNISolone (MEDROL DOSEPAK) 4 MG TBPK tablet, Take as directed, Disp: 21 each, Rfl: 0   albuterol (VENTOLIN HFA) 108 (90 Base) MCG/ACT inhaler, Inhale 2 puffs into the lungs every 4 (four) hours as needed for wheezing or shortness of breath., Disp: 1 each, Rfl: 2   beclomethasone (QVAR REDIHALER) 40 MCG/ACT inhaler, 1 puff Inhalation Once a day, Disp: , Rfl:    beclomethasone (QVAR) 40 MCG/ACT inhaler, Inhale 2 puffs into the lungs 2 (two) times daily. (Patient not taking: Reported on 06/25/2021), Disp: , Rfl:    ibuprofen (ADVIL) 200 MG tablet, 1 tablet with food or milk as needed Orally Three times a day, Disp: , Rfl:    levocetirizine (XYZAL) 5 MG tablet, Take 1 tablet (5 mg total) by mouth daily as needed for allergies., Disp: 30 tablet, Rfl: 5   SUMAtriptan (IMITREX) 50 MG tablet, Take by mouth., Disp: , Rfl:    triamcinolone ointment  (KENALOG) 0.1 %, Apply 1 application topically 2 (two) times daily. (Patient not taking: Reported on 06/25/2021), Disp: 80 g, Rfl: 0   valACYclovir (VALTREX) 1000 MG tablet, TAKE 1 TABLET Every 8 Hours for 7 days, Disp: , Rfl:   Social History   Tobacco Use  Smoking Status Never   Passive exposure: Never  Smokeless Tobacco Never    Allergies  Allergen Reactions   Oxycodone-Acetaminophen Hives, Itching and Nausea And Vomiting   Oxycodone     Other Reaction(s): Unknown   Objective:  There were no vitals filed for this visit. There is no height or weight on file to calculate BMI. Constitutional Well developed. Well nourished.  Vascular Dorsalis pedis pulses palpable bilaterally. Posterior tibial pulses palpable bilaterally. Capillary refill normal to all digits.  No cyanosis or clubbing noted. Pedal hair growth normal.  Neurologic Normal speech. Oriented to person, place, and time. Epicritic sensation to light touch grossly present bilaterally.  Dermatologic Nails well groomed and normal in appearance. No open wounds. No skin lesions.  Orthopedic: Normal joint ROM without pain or crepitus bilaterally. No visible deformities. Tender to palpation at the calcaneal tuber bilaterally. No pain with calcaneal squeeze bilaterally. Ankle ROM diminished range of motion bilaterally. Silfverskiold Test: positive bilaterally.   Radiographs: None  Assessment:   No diagnosis found.  Plan:  Patient was evaluated and treated and all questions answered.  Plantar Fasciitis, bilaterally - XR reviewed as above.  - Educated on icing and stretching. Instructions given.  -Will hold off on injection as they have not helped - DME: Continue cam boot as needed. - Pharmacologic management: None -Continue physical therapy -Medrol Dosepak and meloxicam was sent for pain  No follow-ups on file.

## 2022-07-15 ENCOUNTER — Ambulatory Visit: Payer: BC Managed Care – PPO | Admitting: Podiatry

## 2022-08-15 ENCOUNTER — Other Ambulatory Visit: Payer: Self-pay | Admitting: Obstetrics and Gynecology

## 2022-08-15 DIAGNOSIS — Z1231 Encounter for screening mammogram for malignant neoplasm of breast: Secondary | ICD-10-CM

## 2022-08-24 ENCOUNTER — Ambulatory Visit: Payer: BC Managed Care – PPO | Admitting: Podiatry

## 2022-08-31 ENCOUNTER — Ambulatory Visit: Payer: BC Managed Care – PPO | Admitting: Podiatry

## 2022-09-01 ENCOUNTER — Other Ambulatory Visit: Payer: Self-pay | Admitting: Family Medicine

## 2022-09-01 ENCOUNTER — Encounter: Payer: Self-pay | Admitting: Family Medicine

## 2022-09-01 ENCOUNTER — Other Ambulatory Visit: Payer: Self-pay

## 2022-09-01 ENCOUNTER — Ambulatory Visit (INDEPENDENT_AMBULATORY_CARE_PROVIDER_SITE_OTHER): Payer: BC Managed Care – PPO | Admitting: Family Medicine

## 2022-09-01 VITALS — BP 98/80 | HR 16 | Temp 98.3°F | Resp 16

## 2022-09-01 DIAGNOSIS — H6993 Unspecified Eustachian tube disorder, bilateral: Secondary | ICD-10-CM

## 2022-09-01 DIAGNOSIS — J310 Chronic rhinitis: Secondary | ICD-10-CM | POA: Diagnosis not present

## 2022-09-01 DIAGNOSIS — J4530 Mild persistent asthma, uncomplicated: Secondary | ICD-10-CM | POA: Diagnosis not present

## 2022-09-01 DIAGNOSIS — Z9189 Other specified personal risk factors, not elsewhere classified: Secondary | ICD-10-CM | POA: Diagnosis not present

## 2022-09-01 DIAGNOSIS — U071 COVID-19: Secondary | ICD-10-CM

## 2022-09-01 DIAGNOSIS — B349 Viral infection, unspecified: Secondary | ICD-10-CM

## 2022-09-01 LAB — BASIC METABOLIC PANEL
BUN/Creatinine Ratio: 5 — ABNORMAL LOW (ref 9–23)
BUN: 4 mg/dL — ABNORMAL LOW (ref 6–24)
CO2: 25 mmol/L (ref 20–29)
Calcium: 9.4 mg/dL (ref 8.7–10.2)
Chloride: 104 mmol/L (ref 96–106)
Creatinine, Ser: 0.77 mg/dL (ref 0.57–1.00)
Glucose: 102 mg/dL — ABNORMAL HIGH (ref 70–99)
Potassium: 4.2 mmol/L (ref 3.5–5.2)
Sodium: 141 mmol/L (ref 134–144)
eGFR: 96 mL/min/{1.73_m2} (ref >59)

## 2022-09-01 MED ORDER — FLUTICASONE PROPIONATE 50 MCG/ACT NA SUSP: NASAL | 1 refills | Status: AC

## 2022-09-01 MED ORDER — NIRMATRELVIR/RITONAVIR (PAXLOVID)TABLET: 3.0000 | ORAL_TABLET | Freq: Two times a day (BID) | ORAL | 0 refills | Status: AC

## 2022-09-01 MED ORDER — LEVOCETIRIZINE DIHYDROCHLORIDE 5 MG PO TABS: ORAL_TABLET | ORAL | 1 refills | Status: DC

## 2022-09-01 MED ORDER — PREDNISONE 10 MG PO TABS: ORAL_TABLET | ORAL | 0 refills | Status: AC

## 2022-09-01 MED ORDER — ALBUTEROL SULFATE HFA 108 (90 BASE) MCG/ACT IN AERS: 2.0000 | INHALATION_SPRAY | RESPIRATORY_TRACT | 1 refills | Status: AC | PRN

## 2022-09-01 NOTE — Progress Notes (Signed)
Patient aware of lab result. Paxlovid called to pharmacy and patient aware and without further questions.

## 2022-09-01 NOTE — Progress Notes (Signed)
[address] [place] [address] Dept: [phone]  FOLLOW UP NOTE  Patient ID: Alicia Hanson, female    DOB: May 16, 1975  Age: 47 y.o. MRN: [id number] Date of Office Visit: 09/01/2022  Assessment  Chief Complaint: Cough, Nasal Congestion, and Headache  HPI Alicia Hanson is a 46 female who presents to the clinic for follow-up visit.  She was last seen in this clinic on 12/23/2018.  For evaluation of asthma, chronic rhinitis, eustachian tube dysfunction, and food sensitivity to strawberries and pineapple.   At today's visit, she reports that last week Thursday she developed a nosebleed occurring from the left nostril and lasting 30 minutes.  She reports that on the following Tuesday she developed sinus pain and pressure.  On Wednesday she developed rhinorrhea ranging from yellow to clear, cough producing yellow to clear mucus, headache across her forehead and around both eyes, pressure in her sinuses, and pain in both shoulders.  She denies shortness of breath or wheeze and does report cough which is producing mucus.  She reports her asthma has been well-controlled and she usually does not need to use albuterol, however, she did use the albuterol last night with no improvement of symptoms.  She denies fever, sweats, chills, and sick contacts.  Of note, she does work as a Insurance claims handler.  She does note pain in both shoulders.  Allergic rhinitis is reported as well-controlled prior to this week.  At today's visit, she reports rhinorrhea ranging in color from clear to yellow, nasal congestion, occasional sneezing, and copious postnasal drainage.  She continues Xyzal 5 mg once a day and is not currently using nasal saline rinses or nasal saline spray.  She reports that she has tried using nasal steroid spray before which resulted in vomiting.  She reports that she just cannot use nasal preparations.  She reports that about 2 months ago she received steroids for plantar  fasciitis.  Prior to that, she reports no steroids for several years.  Her last environmental allergy skin testing was on 11/17/2020 and was negative to the adult environmental panel.  Follow-up blood work indicates negative environmental allergies and negative food allergies.  Her current medications are listed in the chart.  Drug Allergies:  Allergies  Allergen Reactions   Oxycodone-Acetaminophen Hives, Itching and Nausea And Vomiting   Oxycodone     Other Reaction(s): Unknown    Physical Exam: BP 98/80 (BP Location: Right Arm, Patient Position: Sitting, Cuff Size: Normal)   Pulse (!) 16   Temp 98.3 F (36.8 C) (Temporal)   Resp 16   SpO2 98%    Physical Exam Vitals reviewed.  Constitutional:      Appearance: Normal appearance. She is well-developed.  HENT:     Head: Normocephalic and atraumatic.     Right Ear: Ear canal normal.     Left Ear: Ear canal normal.     Ears:     Comments: Bilateral TMs with clear effusion.  Ear canals without redness    Nose:     Comments: Bilateral nares grossly edematous and pale with thick clear nasal drainage.  Pharynx erythematous with no exudate.  Eyes normal. Eyes:     Conjunctiva/sclera: Conjunctivae normal.  Cardiovascular:     Rate and Rhythm: Normal rate and regular rhythm.     Heart sounds: Normal heart sounds. No murmur heard. Pulmonary:     Effort: Pulmonary effort is normal.     Breath sounds: Normal breath sounds.  Comments: Lungs clear to auscultation Musculoskeletal:        General: Normal range of motion.     Cervical back: Normal range of motion and neck supple.  Skin:    General: Skin is warm and dry.  Neurological:     Mental Status: She is alert and oriented to person, place, and time.  Psychiatric:        Mood and Affect: Mood normal.        Behavior: Behavior normal.        Thought Content: Thought content normal.        Judgment: Judgment normal.     Diagnostics: Spirometry deferred due to frequent  coughing  COVID testing  Assessment and Plan: 1. Mild persistent asthma, uncomplicated   2. Chronic rhinitis   3. Dysfunction of both eustachian tubes   4. At increased risk of exposure to COVID-19 virus   5. Viral illness   6. COVID     Meds ordered this encounter  Medications   albuterol (VENTOLIN HFA) 108 (90 Base) MCG/ACT inhaler    Sig: Inhale 2 puffs into the lungs every 4 (four) hours as needed for wheezing or shortness of breath.    Dispense:  18 g    Refill:  1   predniSONE (DELTASONE) 10 MG tablet    Sig: Take 2 tablets once a day for 4 days, then 1 tablet on the 5th day.    Dispense:  9 tablet    Refill:  0   DISCONTD: levocetirizine (XYZAL) 5 MG tablet    Sig: Take 5mg  once daily as needed for runny nose or itch.    Dispense:  30 tablet    Refill:  1   fluticasone (FLONASE) 50 MCG/ACT nasal spray    Sig: 2 sprays each nostril 2 times daily as needed for stuffy nose.    Dispense:  1 g    Refill:  1   nirmatrelvir/ritonavir (PAXLOVID) 20 x 150 MG & 10 x 100MG  TABS    Sig: Take 3 tablets by mouth 2 (two) times daily for 5 days. Patient GFR is 96. Take nirmatrelvir (150 mg) two tablets twice daily for 5 days and ritonavir (100 mg) one tablet twice daily for 5 days.    Dispense:  30 tablet    Refill:  0    Patient Instructions  COVID Your rapid COVID swab was positive A lab has been ordered to check your kidney function. We will call you when there results become available A prescription for Paxlovid will be provided if the kidney function is within normal limits. Continue to mask in public areas  Continue to increase fluid intake as tolerated  Asthma Continue albuterol 2 puffs once every 4 hours as needed for cough or wheeze You may use albuterol 2 puffs 5 to 15 minutes before activity to decrease cough or wheeze For asthma flare, begin AirDuo 232-1 puff twice a day for 2 weeks or until cough and wheeze free  Cough Prednisone 10 mg tablets. Take 2 tablets  once a day for 4 days, then take 1 tablet on the 5th day, then stop.   Hold Xyzal for about 1 week Begin Mucinex [phone] mg twice a day to this out mucus Increase fluid intake as tolerated  Chronic rhinitis Hold Xyzal for 5 days, then continue Xyzal 5 mg once a day as needed for runny nose or itch.  You may take an additional dose of Xyzal 5 mg once a day as  needed for breakthrough symptoms. Remember to rotate to a different antihistamine about every 3 months. Some examples of over the counter antihistamines include Zyrtec (cetirizine), Xyzal (levocetirizine), Allegra (fexofenadine), and Claritin (loratidine).  Begin Flonase 2 sprays in each nostril twice a day as needed for a stuffy nose.  In the right nostril, point the applicator out toward the right ear. In the left nostril, point the applicator out toward the left ear Begin saline nasal rinses as needed for nasal symptoms. Use this before any medicated nasal sprays for best result  Eustachian tube dysfunction Begin nasal saline rinses followed by nasal steroid spray (Flonase) daily.  May need ENT or audiology consult for intermittent hearing changes  Food intolerance/oral allergy syndrome Food allergy testing was negative to strawberry and pineapple at your last visit.  You may introduce these foods at home if desired. You may take an additional dose of antihistamine if needed before or after eating these foods.   Call the clinic if this treatment plan is not working well for you.  Follow up in 1 month or sooner if needed.  Return in about 4 weeks (around 09/29/2022), or if symptoms worsen or fail to improve.    Thank you for the opportunity to care for this patient.  Please do not hesitate to contact me with questions.  Thermon Leyland, FNP [facility]

## 2022-09-01 NOTE — Patient Instructions (Addendum)
COVID Your rapid COVID swab was positive A lab has been ordered to check your kidney function. We will call you when there results become available A prescription for Paxlovid will be provided if the kidney function is within normal limits. Continue to mask in public areas  Continue to increase fluid intake as tolerated  Asthma Continue albuterol 2 puffs once every 4 hours as needed for cough or wheeze You may use albuterol 2 puffs 5 to 15 minutes before activity to decrease cough or wheeze For asthma flare, begin AirDuo 232-1 puff twice a day for 2 weeks or until cough and wheeze free  Cough Prednisone 10 mg tablets. Take 2 tablets once a day for 4 days, then take 1 tablet on the 5th day, then stop.   Hold Xyzal for about 1 week Begin Mucinex [phone] mg twice a day to this out mucus Increase fluid intake as tolerated  Chronic rhinitis Hold Xyzal for 5 days, then continue Xyzal 5 mg once a day as needed for runny nose or itch.  You may take an additional dose of Xyzal 5 mg once a day as needed for breakthrough symptoms. Remember to rotate to a different antihistamine about every 3 months. Some examples of over the counter antihistamines include Zyrtec (cetirizine), Xyzal (levocetirizine), Allegra (fexofenadine), and Claritin (loratidine).  Begin Flonase 2 sprays in each nostril twice a day as needed for a stuffy nose.  In the right nostril, point the applicator out toward the right ear. In the left nostril, point the applicator out toward the left ear Begin saline nasal rinses as needed for nasal symptoms. Use this before any medicated nasal sprays for best result  Eustachian tube dysfunction Begin nasal saline rinses followed by nasal steroid spray (Flonase) daily.  May need ENT or audiology consult for intermittent hearing changes  Food intolerance/oral allergy syndrome Food allergy testing was negative to strawberry and pineapple at your last visit.  You may introduce these foods at  home if desired. You may take an additional dose of antihistamine if needed before or after eating these foods.   Call the clinic if this treatment plan is not working well for you.  Follow up in 1 month or sooner if needed.

## 2022-09-12 ENCOUNTER — Ambulatory Visit: Payer: BC Managed Care – PPO

## 2022-09-13 ENCOUNTER — Ambulatory Visit
Admission: RE | Admit: 2022-09-13 | Discharge: 2022-09-13 | Disposition: A | Discharge status: Home / Self Care | Payer: BC Managed Care – PPO | Source: Ambulatory Visit | Attending: Obstetrics and Gynecology | Admitting: Obstetrics and Gynecology

## 2022-09-13 DIAGNOSIS — Z1231 Encounter for screening mammogram for malignant neoplasm of breast: Secondary | ICD-10-CM

## 2022-09-15 ENCOUNTER — Other Ambulatory Visit: Payer: Self-pay

## 2022-09-15 ENCOUNTER — Encounter: Payer: Self-pay | Admitting: Family Medicine

## 2022-09-15 ENCOUNTER — Ambulatory Visit (INDEPENDENT_AMBULATORY_CARE_PROVIDER_SITE_OTHER): Payer: BC Managed Care – PPO | Admitting: Family Medicine

## 2022-09-15 VITALS — BP 120/80 | HR 61 | Temp 98.2°F | Resp 20 | Wt 179.6 lb

## 2022-09-15 DIAGNOSIS — K9049 Malabsorption due to intolerance, not elsewhere classified: Secondary | ICD-10-CM

## 2022-09-15 DIAGNOSIS — J4530 Mild persistent asthma, uncomplicated: Secondary | ICD-10-CM

## 2022-09-15 DIAGNOSIS — L2084 Intrinsic (allergic) eczema: Secondary | ICD-10-CM

## 2022-09-15 DIAGNOSIS — J310 Chronic rhinitis: Secondary | ICD-10-CM | POA: Diagnosis not present

## 2022-09-15 DIAGNOSIS — R21 Rash and other nonspecific skin eruption: Secondary | ICD-10-CM | POA: Diagnosis not present

## 2022-09-15 MED ORDER — EUCRISA 2 % EX OINT
1.0000 | TOPICAL_OINTMENT | Freq: Two times a day (BID) | CUTANEOUS | 5 refills | Status: AC | PRN
Start: 2022-09-15 — End: ?

## 2022-09-15 MED ORDER — TRIAMCINOLONE ACETONIDE 0.1 % EX OINT
1.0000 | TOPICAL_OINTMENT | Freq: Two times a day (BID) | CUTANEOUS | 5 refills | Status: AC
Start: 2022-09-15 — End: ?

## 2022-09-15 MED ORDER — DESONIDE 0.05 % EX OINT: 1.0000 | TOPICAL_OINTMENT | Freq: Two times a day (BID) | CUTANEOUS | 5 refills | Status: AC

## 2022-09-15 NOTE — Patient Instructions (Addendum)
Asthma Continue albuterol 2 puffs once every 4 hours as needed for cough or wheeze You may use albuterol 2 puffs 5 to 15 minutes before activity to decrease cough or wheeze For asthma flare, begin AirDuo 232-1 puff twice a day for 2 weeks or until cough and wheeze free  Chronic rhinitis Restart Xyzal 5 mg once a day as needed for runny nose or itch.  You may take an additional dose of Xyzal 5 mg once a day as needed for breakthrough symptoms. Remember to rotate to a different antihistamine about every 3 months. Some examples of over the counter antihistamines include Zyrtec (cetirizine), Xyzal (levocetirizine), Allegra (fexofenadine), and Claritin (loratidine).  Continue Flonase 2 sprays in each nostril twice a day as needed for a stuffy nose.  In the right nostril, point the applicator out toward the right ear. In the left nostril, point the applicator out toward the left ear Continue saline nasal rinses as needed for nasal symptoms. Use this before any medicated nasal sprays for best result  Papular rash Increase Xyzal to 5 mg twice a day for 1-2 weeks, then take Xyzal once a day as needed  Begin desonide 0.05% to red and itchy areas up to twice a day as needed.  If your symptoms re-occur, begin a journal of events that occurred for up to 6 hours before your symptoms began including foods and beverages consumed, soaps or perfumes you had contact with, and medications. Take pictures daily  Atopic dermatitis Continue a twice a day moisturizing routine Begin Eucrisa to red and itchy areas twice a day as needed  Continue triamcinolone to red and itchy areas below your face up to twice a day as needed.   Food intolerance/oral allergy syndrome Food allergy testing was negative to strawberry and pineapple.  You may introduce these foods at home if desired. You may take an additional dose of antihistamine if needed before or after eating these foods.   Call the clinic if this treatment plan is not  working well for you.  Follow up in 6 months or sooner if needed.

## 2022-09-15 NOTE — Progress Notes (Signed)
[address] [place] [address] Dept: [phone]  FOLLOW UP NOTE  Patient ID: Alicia Hanson, female    DOB: 01-16-1976  Age: 47 y.o. MRN: [id number] Date of Office Visit: 09/15/2022  Assessment  Chief Complaint: Other (Red dots on face that were noticed on Monday and it started to progress with more redness and itchiness )  HPI ASAL TIGNOR is a 47 year old female who presents to the clinic for follow-up visit.  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   At today's visit, she reports that she noticed a little raised rash scattered across both cheeks that began on Monday night.  She reports this rash was immediately itching and has not changed in number or character since Monday night.  She denies concomitant cardiopulmonary or gastrointestinal symptoms with this itch.  She denies new medications, foods, personal care products, or insect stings.  She reports that she has never had a rash such as this.  She has tried hydrocortisone 1% with mild relief of symptoms.  She is not currently taking Xyzal.    She does report that she occasionally gets red, dry, eczematous areas that occur on her lower extremities, ankles, and feet.  She continues a twice a day moisturizing routine and occasionally uses triamcinolone for relief of eczematous flares.  Allergic rhinitis is reported as moderately well-controlled with nasal congestion occurring in the morning as her main symptom.  She denies clear rhinorrhea, sneezing, or postnasal drainage.  She is not currently using Xyzal, Flonase, or nasal saline rinses.  Her environmental allergy skin testing on 11/17/2020 was negative to the adult environmental panel.  Follow-up blood work indicates negative environmental allergies and negative food allergies.    Eustachian tube dysfunction  is reported as having resolved.  COVID symptoms have resolved at this time.  She continues to have avoid pineapple and strawberries with no accidental ingestion or EpiPen use since her last visit to this clinic.  EpiPen auto injector sets are up-to-date.  Her current medications are listed in the chart.   Allergies  Allergen Reactions   Oxycodone-Acetaminophen Hives, Itching and Nausea And Vomiting   Oxycodone     Other Reaction(s): Unknown      Drug Allergies:  Allergies  Allergen Reactions   Oxycodone-Acetaminophen Hives, Itching and Nausea And Vomiting   Oxycodone     Other Reaction(s): Unknown    Physical Exam: BP 120/80   Pulse 61   Temp 98.2 F (36.8 C) (Temporal)   Resp 20   Wt 179 lb 9.6 oz (81.5 kg)   SpO2 100%   BMI 32.85 kg/m    Physical Exam Vitals reviewed.  Constitutional:      Appearance: Normal appearance.  HENT:     Head: Normocephalic and atraumatic.     Right Ear: Tympanic membrane normal.     Left Ear: Tympanic membrane normal.     Nose:     Comments: Bilateral naris edematous and pale with thin clear nasal drainage noted.  Pharynx normal.  Ears normal.  Eyes normal.    Mouth/Throat:     Pharynx: Oropharynx is clear.  Eyes:     Conjunctiva/sclera: Conjunctivae normal.  Cardiovascular:     Rate and Rhythm: Regular rhythm.     Heart sounds: Normal heart sounds. No murmur heard. Pulmonary:     Effort: Pulmonary effort is normal.  Breath sounds: Normal breath sounds.     Comments: Lungs clear to auscultation Musculoskeletal:        General: Normal range of motion.     Cervical back: Normal range of motion and neck supple.  Skin:    General: Skin is warm and dry.     Comments: Scattered pinpoint raised areas noted on bilateral cheeks.  No raised areas noted on her nose, forehead, or chin.  No open areas or drainage noted.  Neurological:     Mental Status: She is alert and oriented to person, place, and time.  Psychiatric:        Mood  and Affect: Mood normal.        Behavior: Behavior normal.        Thought Content: Thought content normal.        Judgment: Judgment normal.     Diagnostics: FVC 3.97 which is 95% of predicted value, FEV1 3.36 which is 101% of predicted value.  Spirometry indicates normal ventilatory function.  Assessment and Plan: 1. Rash and nonspecific skin eruption   2. Mild persistent asthma, uncomplicated   3. Chronic rhinitis   4. Food intolerance   5. Intrinsic atopic dermatitis     Meds ordered this encounter  Medications   desonide (DESOWEN) 0.05 % ointment    Sig: Apply 1 Application topically 2 (two) times daily.    Dispense:  60 g    Refill:  5   Crisaborole (EUCRISA) 2 % OINT    Sig: Apply 1 Application topically 2 (two) times daily as needed.    Dispense:  60 g    Refill:  5   triamcinolone ointment (KENALOG) 0.1 %    Sig: Apply 1 Application topically 2 (two) times daily.    Dispense:  80 g    Refill:  5    Patient Instructions  Asthma Continue albuterol 2 puffs once every 4 hours as needed for cough or wheeze You may use albuterol 2 puffs 5 to 15 minutes before activity to decrease cough or wheeze For asthma flare, begin AirDuo 232-1 puff twice a day for 2 weeks or until cough and wheeze free  Chronic rhinitis Restart Xyzal 5 mg once a day as needed for runny nose or itch.  You may take an additional dose of Xyzal 5 mg once a day as needed for breakthrough symptoms. Remember to rotate to a different antihistamine about every 3 months. Some examples of over the counter antihistamines include Zyrtec (cetirizine), Xyzal (levocetirizine), Allegra (fexofenadine), and Claritin (loratidine).  Continue Flonase 2 sprays in each nostril twice a day as needed for a stuffy nose.  In the right nostril, point the applicator out toward the right ear. In the left nostril, point the applicator out toward the left ear Continue saline nasal rinses as needed for nasal symptoms. Use this before  any medicated nasal sprays for best result  Papular rash Increase Xyzal to 5 mg twice a day for 1-2 weeks, then take Xyzal once a day as needed  Begin desonide 0.05% to red and itchy areas up to twice a day as needed.  If your symptoms re-occur, begin a journal of events that occurred for up to 6 hours before your symptoms began including foods and beverages consumed, soaps or perfumes you had contact with, and medications. Take pictures daily  Atopic dermatitis Continue a twice a day moisturizing routine Begin Eucrisa to red and itchy areas twice a day as needed  Continue triamcinolone to  red and itchy areas below your face up to twice a day as needed.   Food intolerance/oral allergy syndrome Food allergy testing was negative to strawberry and pineapple.  You may introduce these foods at home if desired. You may take an additional dose of antihistamine if needed before or after eating these foods.   Call the clinic if this treatment plan is not working well for you.  Follow up in 6 months or sooner if needed.  Return in about 6 months (around 03/17/2023), or if symptoms worsen or fail to improve.    Thank you for the opportunity to care for this patient.  Please do not hesitate to contact me with questions.  Thermon Leyland, FNP [facility]

## 2022-09-20 ENCOUNTER — Telehealth: Payer: Self-pay

## 2022-09-20 NOTE — Telephone Encounter (Signed)
Patient Advocate Encounter   Received notification from [employer] that prior authorization is required for Eucrisa 2% ointment   Submitted: 09-20-2022 Key [id number]  Status is pending

## 2022-09-21 ENCOUNTER — Ambulatory Visit: Payer: BC Managed Care – PPO | Admitting: Podiatry

## 2022-09-21 NOTE — Telephone Encounter (Signed)
PA has been APPROVED from 09/20/2022-12/21/2022  Approval letter attached in patients media

## 2022-10-03 ENCOUNTER — Ambulatory Visit: Payer: BC Managed Care – PPO | Admitting: Family Medicine

## 2022-10-03 NOTE — Progress Notes (Deleted)
   [address] [place] [address] Dept: [phone]  FOLLOW UP NOTE  Patient ID: [name], female    DOB: 05-29-1975  Age: 47 y.o. MRN: [id number] Date of Office Visit: 10/03/2022  Assessment  Chief Complaint: No chief complaint on file.  HPI CHAVELY GARITY is a 47 year old female who presents to the clinic for follow-up visit.  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 Her environmental allergy skin testing on 11/17/2020 was negative to the adult environmental panel.  Follow-up blood work indicates negative environmental allergies and negative food allergies.    Drug Allergies:  Allergies  Allergen Reactions   Oxycodone-Acetaminophen Hives, Itching and Nausea And Vomiting   Oxycodone     Other Reaction(s): Unknown    Physical Exam: There were no vitals taken for this visit.   Physical Exam  Diagnostics:    Assessment and Plan: No diagnosis found.  No orders of the defined types were placed in this encounter.   There are no Patient Instructions on file for this visit.  No follow-ups on file.    Thank you for the opportunity to care for this patient.  Please do not hesitate to contact me with questions.  Thermon Leyland, FNP [facility]

## 2023-06-29 ENCOUNTER — Telehealth: Payer: Self-pay

## 2023-06-29 NOTE — Telephone Encounter (Signed)
*  Asthma/Allergy  Pharmacy Patient Advocate Encounter   Received notification from CoverMyMeds that prior authorization for Eucrisa 2% ointment  is required/requested.   Insurance verification completed.   The patient is insured through [employer] [facility] .   Per test claim: PA required; PA submitted to above mentioned insurance via CoverMyMeds Key/confirmation #/EOC [id number] Status is pending

## 2023-09-11 ENCOUNTER — Other Ambulatory Visit: Payer: Self-pay | Admitting: Obstetrics and Gynecology

## 2023-09-11 DIAGNOSIS — Z1231 Encounter for screening mammogram for malignant neoplasm of breast: Secondary | ICD-10-CM

## 2023-09-14 ENCOUNTER — Ambulatory Visit
Admission: RE | Admit: 2023-09-14 | Discharge: 2023-09-14 | Disposition: A | Discharge status: Home / Self Care | Source: Ambulatory Visit | Attending: Obstetrics and Gynecology | Admitting: Obstetrics and Gynecology

## 2023-09-14 DIAGNOSIS — Z1231 Encounter for screening mammogram for malignant neoplasm of breast: Secondary | ICD-10-CM

## 2023-09-18 ENCOUNTER — Other Ambulatory Visit: Payer: Self-pay | Admitting: Obstetrics and Gynecology

## 2023-09-18 DIAGNOSIS — R928 Other abnormal and inconclusive findings on diagnostic imaging of breast: Secondary | ICD-10-CM

## 2023-09-19 ENCOUNTER — Ambulatory Visit
Admission: RE | Admit: 2023-09-19 | Discharge: 2023-09-19 | Disposition: A | Discharge status: Home / Self Care | Source: Ambulatory Visit | Attending: Obstetrics and Gynecology | Admitting: Obstetrics and Gynecology

## 2023-09-19 ENCOUNTER — Ambulatory Visit

## 2023-09-19 DIAGNOSIS — R928 Other abnormal and inconclusive findings on diagnostic imaging of breast: Secondary | ICD-10-CM

## 2023-09-27 ENCOUNTER — Encounter

## 2023-09-27 ENCOUNTER — Other Ambulatory Visit
